# Patient Record
Sex: Female | Born: 1950 | Race: White | Hispanic: No | Smoking: Never smoker
Health system: Southern US, Community
[De-identification: ages and names within clinical notes are randomized; demographics above are authoritative.]

## PROBLEM LIST (undated history)

## (undated) DIAGNOSIS — R112 Nausea with vomiting, unspecified: Secondary | ICD-10-CM

## (undated) DIAGNOSIS — U099 Post covid-19 condition, unspecified: Secondary | ICD-10-CM

## (undated) DIAGNOSIS — R55 Syncope and collapse: Secondary | ICD-10-CM

## (undated) DIAGNOSIS — I10 Essential (primary) hypertension: Secondary | ICD-10-CM

## (undated) DIAGNOSIS — G4700 Insomnia, unspecified: Secondary | ICD-10-CM

## (undated) DIAGNOSIS — I480 Paroxysmal atrial fibrillation: Secondary | ICD-10-CM

## (undated) DIAGNOSIS — J849 Interstitial pulmonary disease, unspecified: Secondary | ICD-10-CM

## (undated) DIAGNOSIS — J302 Other seasonal allergic rhinitis: Secondary | ICD-10-CM

## (undated) DIAGNOSIS — K449 Diaphragmatic hernia without obstruction or gangrene: Secondary | ICD-10-CM

## (undated) DIAGNOSIS — D649 Anemia, unspecified: Secondary | ICD-10-CM

## (undated) DIAGNOSIS — I483 Typical atrial flutter: Secondary | ICD-10-CM

## (undated) DIAGNOSIS — Z9889 Other specified postprocedural states: Secondary | ICD-10-CM

## (undated) DIAGNOSIS — M542 Cervicalgia: Secondary | ICD-10-CM

## (undated) DIAGNOSIS — M130 Polyarthritis, unspecified: Secondary | ICD-10-CM

## (undated) HISTORY — DX: Diaphragmatic hernia without obstruction or gangrene: K44.9

## (undated) HISTORY — DX: Syncope and collapse: R55

## (undated) HISTORY — DX: Insomnia, unspecified: G47.00

## (undated) HISTORY — DX: Other seasonal allergic rhinitis: J30.2

## (undated) HISTORY — DX: Anemia, unspecified: D64.9

## (undated) HISTORY — DX: Paroxysmal atrial fibrillation: I48.0

## (undated) HISTORY — DX: Typical atrial flutter: I48.3

## (undated) HISTORY — DX: Essential (primary) hypertension: I10

## (undated) HISTORY — PX: TOE SURGERY: SHX1073

## (undated) HISTORY — DX: Post covid-19 condition, unspecified: U09.9

## (undated) HISTORY — DX: Interstitial pulmonary disease, unspecified: J84.9

## (undated) HISTORY — DX: Cervicalgia: M54.2

## (undated) HISTORY — PX: KNEE SURGERY: SHX244

## (undated) HISTORY — DX: Polyarthritis, unspecified: M13.0

---
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
## 1981-10-01 HISTORY — PX: TUBAL LIGATION: SHX77

## 1988-10-01 HISTORY — PX: TONSILLECTOMY: SUR1361

## 1998-06-02 ENCOUNTER — Ambulatory Visit: Admission: RE | Admit: 1998-06-02 | Discharge: 1998-06-02 | Payer: Self-pay | Admitting: Internal Medicine

## 2001-10-22 ENCOUNTER — Ambulatory Visit (HOSPITAL_COMMUNITY): Admission: RE | Admit: 2001-10-22 | Discharge: 2001-10-22 | Payer: Self-pay | Admitting: Neurology

## 2004-09-18 ENCOUNTER — Ambulatory Visit: Payer: Self-pay | Admitting: Family Medicine

## 2004-10-10 ENCOUNTER — Ambulatory Visit: Payer: Self-pay | Admitting: Internal Medicine

## 2005-01-22 ENCOUNTER — Ambulatory Visit: Payer: Self-pay | Admitting: Family Medicine

## 2005-03-21 ENCOUNTER — Ambulatory Visit: Payer: Self-pay | Admitting: Family Medicine

## 2005-08-09 ENCOUNTER — Ambulatory Visit: Payer: Self-pay | Admitting: Family Medicine

## 2005-10-30 ENCOUNTER — Ambulatory Visit: Payer: Self-pay | Admitting: Internal Medicine

## 2005-11-12 ENCOUNTER — Ambulatory Visit: Payer: Self-pay | Admitting: Family Medicine

## 2006-01-01 ENCOUNTER — Ambulatory Visit: Payer: Self-pay | Admitting: Family Medicine

## 2006-02-12 ENCOUNTER — Ambulatory Visit: Payer: Self-pay | Admitting: Family Medicine

## 2006-02-15 ENCOUNTER — Ambulatory Visit: Payer: Self-pay | Admitting: Family Medicine

## 2006-06-26 ENCOUNTER — Ambulatory Visit: Payer: Self-pay | Admitting: Family Medicine

## 2006-10-07 ENCOUNTER — Ambulatory Visit: Payer: Self-pay | Admitting: Internal Medicine

## 2006-10-22 ENCOUNTER — Ambulatory Visit: Payer: Self-pay

## 2006-12-19 ENCOUNTER — Ambulatory Visit: Payer: Self-pay | Admitting: Family Medicine

## 2007-10-02 HISTORY — PX: KNEE ARTHROSCOPY: SUR90

## 2007-10-09 ENCOUNTER — Ambulatory Visit: Payer: Self-pay | Admitting: Internal Medicine

## 2008-10-19 ENCOUNTER — Ambulatory Visit: Payer: Self-pay | Admitting: Internal Medicine

## 2009-01-21 ENCOUNTER — Encounter: Payer: Self-pay | Admitting: Internal Medicine

## 2009-10-19 DIAGNOSIS — I10 Essential (primary) hypertension: Secondary | ICD-10-CM | POA: Insufficient documentation

## 2009-10-19 DIAGNOSIS — I4891 Unspecified atrial fibrillation: Secondary | ICD-10-CM | POA: Insufficient documentation

## 2009-10-20 ENCOUNTER — Ambulatory Visit: Payer: Self-pay | Admitting: Internal Medicine

## 2010-10-31 ENCOUNTER — Encounter: Payer: Self-pay | Admitting: Internal Medicine

## 2010-11-02 NOTE — Letter (Signed)
Summary: Return To Work  [employer], Main Office  [address]   [place], [place] [address]   Phone: [phone]  Fax: [phone]    10/20/2009  TO: WHOM IT MAY CONCERN   RE: Heather Keith [address] [id number]   The above named individual is under my medical care and may work no more than 9hours/day and 45hours/ week until 11/01/09  If you have any further questions or need additional information, please call.     Sincerely,    Sharlot Gowda [facility]  Appended Document: Return To Work should be 2012

## 2010-11-02 NOTE — Assessment & Plan Note (Signed)
Summary: YEARLY/SL  Medications Added FLECAINIDE ACETATE 100 MG TABS (FLECAINIDE ACETATE) Take one tablet by mouth every 12 hours ZITHROMAX 1 GM PACK (AZITHROMYCIN) UAD TYLENOL 325 MG TABS (ACETAMINOPHEN) as needed * CINNAMON 1000MG  2 tabs daily when she could * MECLIZINE HCL as needed CYCLOBENZAPRINE HCL 10 MG TABS (CYCLOBENZAPRINE HCL) as needed ARTHROTEC 75 75-200 MG-MCG TABS (DICLOFENAC-MISOPROSTOL) two times a day as needed ZOLPIDEM TARTRATE 10 MG TABS (ZOLPIDEM TARTRATE) as needed      Allergies Added: ! PCN  Visit Type:  Follow-up Primary Provider:  Lysbeth Galas, MD  CC:  Numbness all over.  History of Present Illness: Heather Keith returns today for followup.  She is a very pleasant 60 year old woman with a history of paroxysmal atrial fibrillation and hypertension.  She has been controlled nicely now for over 10 years on flecainide and beta-blocker therapy.  She continues to do well.  In the last year, she has had only occasional episodes of atrial fibrillation typically lasting less than 30 seconds at a time.  She has had no syncope or near-syncope.   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   Current Medications (verified): 1)  Atenolol 25 Mg Tabs (Atenolol) .... Take One Tablet By Mouth Daily 2)  Flecainide Acetate 100 Mg Tabs (Flecainide Acetate) .... Take One Tablet By Mouth Every 12 Hours 3)  Aspirin 81 Mg Tbec (Aspirin) .... Take One Tablet By Mouth Daily 4)  Multivitamins   Tabs (Multiple Vitamin) .... Once Daily 5)  Simvastatin 20 Mg Tabs (Simvastatin) .... Take One Tablet By Mouth Daily At Bedtime 6)  Zithromax 1 Gm Pack (Azithromycin) .... Uad 7)  Tylenol 325 Mg Tabs (Acetaminophen) .... As Needed 8)  Cinnamon 1000mg  .... 2 Tabs Daily When She Could 9)  Meclizine Hcl .... As Needed 10)  Cyclobenzaprine Hcl 10 Mg Tabs (Cyclobenzaprine Hcl) .... As Needed 11)  Arthrotec 75 75-200 Mg-Mcg Tabs (Diclofenac-Misoprostol) .... Two Times A  Day As Needed 12)  Zolpidem Tartrate 10 Mg Tabs (Zolpidem Tartrate) .... As Needed  Allergies (verified): 1)  ! Pcn  Past History:  Past Medical History: Current Problems:  HYPERTENSION, UNSPECIFIED (ICD-401.9) ATRIAL FIBRILLATION (ICD-427.31)    Review of Systems  The patient denies chest pain, syncope, dyspnea on exertion, and peripheral edema.    Vital Signs:  Patient profile:   60 year old female Height:      70 inches Weight:      194 pounds BMI:     27.94 Pulse rate:   63 / minute BP sitting:   120 / 80  (left arm)  Vitals Entered By: Laurance Flatten CMA (October 20, 2009 4:01 PM)  Physical Exam  General:  Middle aged well developed, well nourished, in no acute distress.  HEENT: normal Neck: supple. No JVD. Carotids 2+ bilaterally no bruits Cor: RRR no rubs, gallops or murmur Lungs: CTA Ab: soft, nontender. nondistended. No HSM. Good bowel sounds Ext: warm. no cyanosis, clubbing or edema Neuro: alert and oriented. Grossly nonfocal. affect pleasant    EKG  Procedure date:  10/20/2009  Findings:      Normal sinus rhythm with rate of: 63. First degree AV-Block noted.    Impression & Recommendations:  Problem # 1:  ATRIAL FIBRILLATION (ICD-427.31) Her symptoms remain well controlled on her current meds.  Her ECG looks good with no significant QRS widening. Her updated medication list for this problem includes:    Atenolol 25 Mg Tabs (Atenolol) .Marland KitchenMarland KitchenMarland KitchenMarland Kitchen  Take one tablet by mouth daily    Flecainide Acetate 100 Mg Tabs (Flecainide acetate) .Marland Kitchen... Take one tablet by mouth every 12 hours    Aspirin 81 Mg Tbec (Aspirin) .Marland Kitchen... Take one tablet by mouth daily  Orders: EKG w/ Interpretation ([address])  Problem # 2:  HYPERTENSION, UNSPECIFIED (ICD-401.9) Her blood pressure remains well controlled. Continue current meds. A low sodium diet is recommended. Her updated medication list for this problem includes:    Atenolol 25 Mg Tabs (Atenolol) .Marland Kitchen... Take one tablet by mouth  daily    Aspirin 81 Mg Tbec (Aspirin) .Marland Kitchen... Take one tablet by mouth daily  Orders: EKG w/ Interpretation ([address])  Patient Instructions: 1)  Your physician recommends that you schedule a follow-up appointment in: 12 months with Dr Ladona Ridgel Prescriptions: FLECAINIDE ACETATE 100 MG TABS (FLECAINIDE ACETATE) Take one tablet by mouth every 12 hours  #60 x 11   Entered by:   Dennis Bast, RN, BSN   Authorized by:   Laren Boom, MD, [facility]   Signed by:   Dennis Bast, RN, BSN on 10/20/2009   Method used:   Electronically to        [facility]  [address] [id number] (retail)       [address]       [place], [place]  [address]       Ph: [phone] or [phone]       Fax: [phone]   RxID:   [id number] ATENOLOL 25 MG TABS (ATENOLOL) Take one tablet by mouth daily  #34 x 11   Entered by:   Dennis Bast, RN, BSN   Authorized by:   Laren Boom, MD, [facility]   Signed by:   Dennis Bast, RN, BSN on 10/20/2009   Method used:   Electronically to        [facility]  [address] [id number] (retail)       [address]       [place], [place]  [address]       Ph: [phone] or [phone]       Fax: [phone]   RxID:   [id number]

## 2010-11-08 ENCOUNTER — Ambulatory Visit: Payer: Self-pay | Admitting: Internal Medicine

## 2010-11-13 ENCOUNTER — Encounter: Payer: Self-pay | Admitting: Internal Medicine

## 2010-11-13 ENCOUNTER — Encounter (INDEPENDENT_AMBULATORY_CARE_PROVIDER_SITE_OTHER): Payer: Federal, State, Local not specified - PPO | Admitting: Internal Medicine

## 2010-11-13 DIAGNOSIS — I10 Essential (primary) hypertension: Secondary | ICD-10-CM

## 2010-11-13 DIAGNOSIS — I4891 Unspecified atrial fibrillation: Secondary | ICD-10-CM

## 2010-11-22 NOTE — Assessment & Plan Note (Signed)
Summary: Heather Keith  Medications Added LIPITOR 10 MG TABS (ATORVASTATIN CALCIUM) Take one tablet by mouth daily.      Allergies Added:   Visit Type:  Follow-up Primary Provider:  Lysbeth Galas, MD  CC:  dizziness with in the last couple of weeks.  History of Present Illness: Heather Keith returns today for followup.  She is a very pleasant 60 year old woman with a history of paroxysmal atrial fibrillation and hypertension.  She has been controlled nicely now for over 10 years on flecainide and beta-blocker therapy.  She continues to do well.  In the last year, she has had only occasional episodes of atrial fibrillation typically lasting less than 30 seconds at a time.  She has had no syncope or near-syncope.   She has had problems with her back and is out on sick leave. She is scheduled to retire.  Current Medications (verified): 1)  Atenolol 25 Mg Tabs (Atenolol) .... Take One Tablet By Mouth Daily 2)  Flecainide Acetate 100 Mg Tabs (Flecainide Acetate) .... Take One Tablet By Mouth Every 12 Hours 3)  Aspirin 81 Mg Tbec (Aspirin) .... Take One Tablet By Mouth Daily 4)  Multivitamins   Tabs (Multiple Vitamin) .... Once Daily 5)  Tylenol 325 Mg Tabs (Acetaminophen) .... As Needed 6)  Cinnamon 1000mg  .... 2 Tabs Daily When She Could 7)  Meclizine Hcl .... As Needed 8)  Arthrotec 75 75-200 Mg-Mcg Tabs (Diclofenac-Misoprostol) .... Two Times A Day As Needed 9)  Zolpidem Tartrate 10 Mg Tabs (Zolpidem Tartrate) .... As Needed 10)  Lipitor 10 Mg Tabs (Atorvastatin Calcium) .... Take One Tablet By Mouth Daily.  Allergies (verified): 1)  ! Pcn  Past History:  Past Medical History: Last updated: 10/20/2009 Current Problems:  HYPERTENSION, UNSPECIFIED (ICD-401.9) ATRIAL FIBRILLATION (ICD-427.31)    Past Surgical History: Last updated: 10/19/2009 Knee surgery  Review of Systems  The patient denies chest pain, syncope, dyspnea on exertion, and peripheral edema.    Vital  Signs:  Patient profile:   60 year old female Height:      70 inches Weight:      185 pounds BMI:     26.64 Pulse rate:   64 / minute BP sitting:   106 / 68  (left arm) Cuff size:   regular  Vitals Entered By: Heather Keith CMA (November 13, 2010 12:07 PM)  Physical Exam  General:  Middle aged well developed, well nourished, in no acute distress.  HEENT: normal Neck: supple. No JVD. Carotids 2+ bilaterally no bruits Cor: RRR no rubs, gallops or murmur Lungs: CTA Ab: soft, nontender. nondistended. No HSM. Good bowel sounds Ext: warm. no cyanosis, clubbing or edema Neuro: alert and oriented. Grossly nonfocal. affect pleasant    EKG  Procedure date:  11/13/2010  Findings:      Sinus bradycardia with rate of:  54.  Impression & Recommendations:  Problem # 1:  ATRIAL FIBRILLATION (ICD-427.31) Her symptoms remain well controlled. she will continue her current meds. Her updated medication list for this problem includes:    Atenolol 25 Mg Tabs (Atenolol) .Marland Kitchen... Take one tablet by mouth daily    Flecainide Acetate 100 Mg Tabs (Flecainide acetate) .Marland Kitchen... Take one tablet by mouth every 12 hours    Aspirin 81 Mg Tbec (Aspirin) .Marland Kitchen... Take one tablet by mouth daily  Problem # 2:  HYPERTENSION, UNSPECIFIED (ICD-401.9) Her blood pressure has been well controlled. Continue meds as  below and maintain a low sodium diet. Her updated medication list for  this problem includes:    Atenolol 25 Mg Tabs (Atenolol) .Marland Kitchen... Take one tablet by mouth daily    Aspirin 81 Mg Tbec (Aspirin) .Marland Kitchen... Take one tablet by mouth daily  Patient Instructions: 1)  Your physician wants you to follow-up in:12 months with Dr Court Joy will receive a reminder letter in the mail two months in advance. If you don't receive a letter, please call our office to schedule the follow-up appointment. 2)  Your physician recommends that you continue on your current medications as directed. Please refer to the Current Medication  list given to you today.

## 2011-02-13 NOTE — Assessment & Plan Note (Signed)
[facility]                         ELECTROPHYSIOLOGY OFFICE NOTE   NAME:WILLIAMSTayllor, Breitenstein                    MRN:          [id number]  DATE:10/09/2007                            DOB:          [birth year]    Ms. Watling returns today for follow-up.  She is a very pleasant middle-  aged woman with paroxysmal atrial fibrillation and hypertension, who  returns today for follow-up.  She had no specific complaints today.  She  has rare palpitations.   MEDICATIONS:  1. Flecainide 100 mg twice daily.  2. Atenolol 25 mg daily.  3. Aspirin 81 daily.  4. Potassium.   PHYSICAL EXAM:  She is a pleasant, well-appearing middle-aged woman in  no distress.  Blood pressure was 110/80, the pulse 56 and regular, respirations were  16.  The weight was 211 pounds.  NECK:  No jugular distention.  LUNGS:  Clear bilaterally to auscultation.  No wheezes, rales or rhonchi  were present.  CARDIOVASCULAR:  A regular rate and rhythm with normal S1 and S2.  ABDOMEN:  Soft, nontender.  EXTREMITIES:  No cyanosis, clubbing or edema.  The pulses were 2+ and  symmetric.   The EKG demonstrates sinus rhythm with sinus bradycardia.   IMPRESSION:  1. Paroxysmal atrial fibrillation.  2. Hypertension.  3. Chronic flecainide therapy.   DISCUSSION:  Overall, Ms. Fagerstrom is stable.  Her atrial fibrillation  has very nicely been controlled with flecainide.  I will plan to see her  back in the office in 1 year.     Doylene Canning. Ladona Ridgel, MD  Electronically Signed    GWT/MedQ  DD: 10/09/2007  DT: 10/09/2007  Job #: [id number]   cc:   Delaney Meigs, M.D.

## 2011-02-13 NOTE — Assessment & Plan Note (Signed)
[facility]                         ELECTROPHYSIOLOGY OFFICE NOTE   NAME:Heather Keith, Heather Keith                    MRN:          [id number]  DATE:10/19/2008                            DOB:          [birth year]    Heather Keith returns today for followup.  She is a very pleasant 60-year-  old woman with a history of paroxysmal atrial fibrillation and  hypertension.  She has been controlled nicely now for over 10 years on  flecainide and beta-blocker therapy.  She continues to do well.  In the  last year, she has had only occasional episodes of atrial fibrillation  typically lasting less than 30 seconds at a time.  She has had no  syncope or near-syncope with anything.  She has in the interim however  undergone knee surgery secondary to torn meniscus for which she is  presently stable.  She returns today for followup.  She denies chest  pain.  She denies shortness of breath.   CURRENT MEDICATIONS:  1. Flecainide 100 twice a day.  2. Atenolol 25 a day.  3. Aspirin 81 a day.  4. Multivitamin.  5. Zocor 20 a day.  6. Arthrotec p.r.n.   PHYSICAL EXAMINATION:  GENERAL:  She is a pleasant well-appearing middle-  aged woman in no distress.  VITAL SIGNS:  Blood pressure today was 150/75, the pulse was 60 and  regular, respirations were 18, weight was 211 pounds.  NECK:  Revealed no jugular venous distention.  LUNGS:  Clear bilaterally to auscultation.  No wheezes, rales, or  rhonchi are present.  There is no increased work of breathing.  CARDIOVASCULAR:  Regular rate and rhythm.  Normal S1 and S2.  There are  no murmurs, rubs, or gallops present.  ABDOMEN:  Soft, nontender.  There is no organomegaly.  EXTREMITIES:  Demonstrated no edema.   EKG demonstrates sinus rhythm with a rightward axis.   IMPRESSION:  1. Paroxysmal atrial fibrillation, now well-controlled on flecainide      and beta-blockers.  2. Hypertension, also well controlled.  3. Recent knee  surgery, presently stable.   DISCUSSION:  Heather Keith is doing quite well.  We will continue her  current medical therapy with flecainide and beta-blockers.  Her EKG  today is normal otherwise and we will continue to see her back in the  office in 1 year, sooner should she have recurrent symptomatic atrial  fib.     Doylene Canning. Ladona Ridgel, MD  Electronically Signed    GWT/MedQ  DD: 10/19/2008  DT: 10/20/2008  Job #: [id number]   cc:   Delaney Meigs, M.D.

## 2011-02-16 NOTE — Assessment & Plan Note (Signed)
[facility]                         ELECTROPHYSIOLOGY OFFICE NOTE   NAME:WILLIAMSJaisha, [name]                    MRN:          [id number]  DATE:10/07/2006                            DOB:          16-Oct-1950    Heather Keith returns today for followup.  She is a very pleasant middle-  aged woman with a history of paroxysmal atrial fibrillation who has done  quite nicely on a combination of flecainide and low-dose beta blockers  for the last several years.  She does have palpitations intermittently,  and in the last year she has noted increasing dyspnea with exertion and  some chest heaviness.  She denies neck or jaw pain, but does note some  chest tightness with her shortness of breath.  These are fairly  infrequent episodes, but they have occurred lately more often, typically  when she is walking fast up a steep hill.  She denies peripheral edema.   PHYSICAL EXAMINATION:  GENERAL:  She is a pleasant, middle-aged woman in  no distress.  VITAL SIGNS:  Blood pressure was 121/76, pulse 67 and regular,  respirations 18.  The weight was 216 pounds.  NECK:  No jugular venous distention.  LUNGS:  Clear bilaterally to auscultation.  CARDIOVASCULAR:  Regular rate and rhythm with normal S1 and S2.  EXTREMITIES:  No edema.   Her EKG demonstrates sinus rhythm with normal axis and intervals.   IMPRESSION:  1. Symptomatic paroxysmal atrial fibrillation.  2. Worsening chest pressure and shortness of breath with exertion.  3. Arthritis.   DISCUSSION:  Ms. Silberstein' chest pain and shortness of breath are  somewhat concerning, although they do not appear to be in an unstable  fashion.  I have recommended that she undergo a chest x-ray and exercise  Myoview study for additional evaluation, the last of both being more  than 5 years ago.     Doylene Canning. Ladona Ridgel, MD  Electronically Signed    GWT/MedQ  DD: 10/07/2006  DT: 10/08/2006  Job #: [id number]   cc:   Delaney Meigs, M.D.

## 2011-05-29 ENCOUNTER — Other Ambulatory Visit: Payer: Self-pay | Admitting: Internal Medicine

## 2011-05-30 ENCOUNTER — Other Ambulatory Visit: Payer: Self-pay | Admitting: *Deleted

## 2011-05-30 MED ORDER — ATENOLOL 25 MG PO TABS: 25.0000 mg | ORAL_TABLET | Freq: Every day | ORAL | Status: DC

## 2011-07-17 ENCOUNTER — Other Ambulatory Visit: Payer: Self-pay | Admitting: Rheumatology

## 2011-07-17 ENCOUNTER — Telehealth: Payer: Self-pay | Admitting: Internal Medicine

## 2011-07-17 ENCOUNTER — Ambulatory Visit
Admission: RE | Admit: 2011-07-17 | Discharge: 2011-07-17 | Disposition: A | Discharge status: Home / Self Care | Payer: Federal, State, Local not specified - PPO | Source: Ambulatory Visit | Attending: Rheumatology | Admitting: Rheumatology

## 2011-07-17 DIAGNOSIS — M542 Cervicalgia: Secondary | ICD-10-CM

## 2011-07-17 NOTE — Telephone Encounter (Signed)
Or [phone], was given prednisone, wants to make sure ok due to her a-fib

## 2011-07-17 NOTE — Telephone Encounter (Signed)
Went to Rheumatologist today Dr Kellie Simmering prescribed Prednisone dose pack for her for neck and shoulder pain  She has not had an episode of afib 2000 She will wait and let me talk with Dr Ladona Ridgel and I will call her tomorrow regarding her recomendations

## 2011-07-25 NOTE — Telephone Encounter (Signed)
Discussed with Dr Ladona Ridgel  He says may or may not cause her to have afib Patient did not pick up medicine She did not want to risk it  She says her shoulder is better  She has been going to yoga and it is feeling better

## 2011-11-13 ENCOUNTER — Ambulatory Visit (INDEPENDENT_AMBULATORY_CARE_PROVIDER_SITE_OTHER): Payer: Federal, State, Local not specified - PPO | Admitting: Internal Medicine

## 2011-11-13 DIAGNOSIS — I4891 Unspecified atrial fibrillation: Secondary | ICD-10-CM

## 2011-11-13 DIAGNOSIS — I10 Essential (primary) hypertension: Secondary | ICD-10-CM

## 2011-11-13 NOTE — Patient Instructions (Signed)
Your physician wants you to follow-up in: 12 months with Dr. Taylor. You will receive a reminder letter in the mail two months in advance. If you don't receive a letter, please call our office to schedule the follow-up appointment.    

## 2011-11-13 NOTE — Assessment & Plan Note (Signed)
Her symptoms are well controlled. She will continue her current medical therapy. 

## 2011-11-13 NOTE — Assessment & Plan Note (Signed)
Her blood pressure is well controlled. She will continue her current medications and maintain a low-sodium diet. 

## 2011-11-13 NOTE — Progress Notes (Signed)
HPI Heather Keith returns today for followup. She is a 61 year old woman with paroxysmal atrial fibrillation, hypertension, and dyslipidemia. Over the last year she has done well. She denies syncope. She has rare palpitations which are typically nonsustained lasting up to a minute at a time. No chest pain, and no shortness of breath or peripheral edema. Allergies  Allergen Reactions  . Penicillins      Current Outpatient Prescriptions  Medication Sig Dispense Refill  . acetaminophen (TYLENOL) 650 MG CR tablet Take 650 mg by mouth every 8 (eight) hours as needed.      Marland Kitchen atenolol (TENORMIN) 25 MG tablet Take 1 tablet (25 mg total) by mouth daily.  30 tablet  6  . atorvastatin (LIPITOR) 10 MG tablet Take 10 mg by mouth daily.      . diclofenac-misoprostol (ARTHROTEC 75) 75-200 MG-MCG per tablet Take 1 tablet by mouth as needed.      . flecainide (TAMBOCOR) 100 MG tablet TAKE 1 TABLET EVERY 12 HOURS  68 tablet  7  . Multiple Vitamins-Minerals (MULTIVITAMIN WITH MINERALS) tablet Take 1 tablet by mouth daily.      Marland Kitchen zolpidem (AMBIEN) 5 MG tablet Take 5 mg by mouth at bedtime as needed.         Past Medical History  Diagnosis Date  . HTN (hypertension)   . AF (atrial fibrillation)     ROS:   All systems reviewed and negative except as noted in the HPI.   Past Surgical History  Procedure Date  . Knee surgery      No family history on file.   History   Social History  . Marital Status: Widowed    Spouse Name: N/A    Number of Children: N/A  . Years of Education: N/A   Occupational History  . Not on file.   Social History Main Topics  . Smoking status: Never Smoker   . Smokeless tobacco: Not on file  . Alcohol Use: Not on file  . Drug Use: Not on file  . Sexually Active: Not on file   Other Topics Concern  . Not on file   Social History Narrative  . No narrative on file     BP 110/72  Pulse 51  Ht 5\' 10"  (1.778 m)  Wt 89.812 kg (198 lb)  BMI 28.41  kg/m2  Physical Exam:  Well appearing middle-aged woman, NAD HEENT: Unremarkable Neck:  No JVD, no thyromegally Lungs:  Clear with no wheezes, rales, or rhonchi. HEART:  Regular bradycardia, no murmurs, no rubs, no clicks Abd:  soft, positive bowel sounds, no organomegally, no rebound, no guarding Ext:  2 plus pulses, no edema, no cyanosis, no clubbing Skin:  No rashes no nodules Neuro:  CN II through XII intact, motor grossly intact  EKG Sinus bradycardia with first degree AV block  Assess/Plan:

## 2012-01-11 ENCOUNTER — Other Ambulatory Visit: Payer: Self-pay | Admitting: Internal Medicine

## 2012-01-11 ENCOUNTER — Other Ambulatory Visit: Payer: Self-pay

## 2012-01-11 MED ORDER — ATENOLOL 25 MG PO TABS: 25.0000 mg | ORAL_TABLET | Freq: Every day | ORAL | Status: DC

## 2012-02-09 ENCOUNTER — Other Ambulatory Visit: Payer: Self-pay | Admitting: Internal Medicine

## 2012-11-10 ENCOUNTER — Ambulatory Visit (INDEPENDENT_AMBULATORY_CARE_PROVIDER_SITE_OTHER): Payer: Federal, State, Local not specified - PPO | Admitting: Internal Medicine

## 2012-11-10 ENCOUNTER — Encounter: Payer: Self-pay | Admitting: Internal Medicine

## 2012-11-10 VITALS — BP 133/80 | HR 64 | Ht 70.0 in | Wt 201.6 lb

## 2012-11-10 DIAGNOSIS — I10 Essential (primary) hypertension: Secondary | ICD-10-CM

## 2012-11-10 DIAGNOSIS — I4891 Unspecified atrial fibrillation: Secondary | ICD-10-CM

## 2012-11-10 NOTE — Assessment & Plan Note (Signed)
She is maintaining sinus rhythm very nicely. She will continue her current medical therapy. 

## 2012-11-10 NOTE — Progress Notes (Signed)
HPI Mrs. Thackston returns today for followup. She is a very pleasant 62 year old woman with a history of paroxysmal atrial fibrillation and hypertension. Her palpitations have been well-controlled in the past year. She has recently retired. She is still walking up to 2 miles a day. She has gained some weight however in retirement. She denies chest pain or shortness of breath. No peripheral edema or syncope. She has rare palpitations. Allergies  Allergen Reactions  . Penicillins      Current Outpatient Prescriptions  Medication Sig Dispense Refill  . acetaminophen (TYLENOL) 650 MG CR tablet Take 650 mg by mouth every 8 (eight) hours as needed.      Marland Kitchen atenolol (TENORMIN) 25 MG tablet Take 1 tablet (25 mg total) by mouth daily.  30 tablet  10  . atorvastatin (LIPITOR) 10 MG tablet Take 10 mg by mouth daily.      . diclofenac-misoprostol (ARTHROTEC 75) 75-200 MG-MCG per tablet Take 1 tablet by mouth as needed.      . flecainide (TAMBOCOR) 100 MG tablet TAKE 1 TABLET EVERY 12 HOURS  68 tablet  7  . Multiple Vitamins-Minerals (MULTIVITAMIN WITH MINERALS) tablet Take 1 tablet by mouth daily.      . simvastatin (ZOCOR) 20 MG tablet       . zolpidem (AMBIEN) 5 MG tablet Take 5 mg by mouth at bedtime as needed.       No current facility-administered medications for this visit.     Past Medical History  Diagnosis Date  . HTN (hypertension)   . AF (atrial fibrillation)     ROS:   All systems reviewed and negative except as noted in the HPI.   Past Surgical History  Procedure Laterality Date  . Knee surgery       No family history on file.   History   Social History  . Marital Status: Widowed    Spouse Name: N/A    Number of Children: N/A  . Years of Education: N/A   Occupational History  . Not on file.   Social History Main Topics  . Smoking status: Never Smoker   . Smokeless tobacco: Not on file  . Alcohol Use: Not on file  . Drug Use: Not on file  . Sexually Active:  Not on file   Other Topics Concern  . Not on file   Social History Narrative  . No narrative on file     BP 133/80  Pulse 64  Ht 5\' 10"  (1.778 m)  Wt 201 lb 9.6 oz (91.445 kg)  BMI 28.93 kg/m2  Physical Exam:  Well appearing middle-age woman,NAD HEENT: Unremarkable Neck:  No JVD, no thyromegally Lungs:  Clear with no wheezes, rales, or rhonchi. HEART:  Regular rate rhythm, no murmurs, no rubs, no clicks Abd:  soft, positive bowel sounds, no organomegally, no rebound, no guarding Ext:  2 plus pulses, no edema, no cyanosis, no clubbing Skin:  No rashes no nodules Neuro:  CN II through XII intact, motor grossly intact  EKG Normal sinus rhythm with nonspecific T wave abnormality.  Assess/Plan:

## 2012-11-10 NOTE — Assessment & Plan Note (Signed)
Her blood pressure is minimally elevated. She is instructed to maintain a low-sodium diet, lose weight, and continue her current medical therapy.

## 2012-11-10 NOTE — Patient Instructions (Signed)
Your physician wants you to follow-up in: 12 months with Dr. Taylor. You will receive a reminder letter in the mail two months in advance. If you don't receive a letter, please call our office to schedule the follow-up appointment.    

## 2012-11-11 ENCOUNTER — Other Ambulatory Visit: Payer: Self-pay | Admitting: Internal Medicine

## 2012-12-13 ENCOUNTER — Other Ambulatory Visit: Payer: Self-pay | Admitting: Internal Medicine

## 2013-03-12 ENCOUNTER — Other Ambulatory Visit: Payer: Self-pay

## 2013-03-12 MED ORDER — ATENOLOL 25 MG PO TABS: ORAL_TABLET | ORAL | Status: DC

## 2013-04-28 ENCOUNTER — Emergency Department (HOSPITAL_COMMUNITY)
Admission: EM | Admit: 2013-04-28 | Discharge: 2013-04-28 | Disposition: A | Discharge status: Home / Self Care | Payer: Federal, State, Local not specified - PPO | Attending: Emergency Medicine | Admitting: Emergency Medicine

## 2013-04-28 ENCOUNTER — Encounter (HOSPITAL_COMMUNITY): Payer: Self-pay

## 2013-04-28 DIAGNOSIS — Z880 Allergy status to penicillin: Secondary | ICD-10-CM | POA: Insufficient documentation

## 2013-04-28 DIAGNOSIS — R55 Syncope and collapse: Secondary | ICD-10-CM

## 2013-04-28 DIAGNOSIS — Z79899 Other long term (current) drug therapy: Secondary | ICD-10-CM | POA: Insufficient documentation

## 2013-04-28 DIAGNOSIS — Z8679 Personal history of other diseases of the circulatory system: Secondary | ICD-10-CM | POA: Insufficient documentation

## 2013-04-28 DIAGNOSIS — I10 Essential (primary) hypertension: Secondary | ICD-10-CM | POA: Insufficient documentation

## 2013-04-28 DIAGNOSIS — Z7982 Long term (current) use of aspirin: Secondary | ICD-10-CM | POA: Insufficient documentation

## 2013-04-28 HISTORY — DX: Syncope and collapse: R55

## 2013-04-28 LAB — URINALYSIS, ROUTINE W REFLEX MICROSCOPIC
Bilirubin Urine: NEGATIVE
Glucose, UA: NEGATIVE mg/dL
Hgb urine dipstick: NEGATIVE
Ketones, ur: NEGATIVE mg/dL
pH: 8 (ref 5.0–8.0)

## 2013-04-28 LAB — POCT I-STAT, CHEM 8
BUN: 18 mg/dL (ref 6–23)
Calcium, Ion: 1.25 mmol/L (ref 1.13–1.30)
Chloride: 103 mEq/L (ref 96–112)
Creatinine, Ser: 1 mg/dL (ref 0.50–1.10)
Glucose, Bld: 131 mg/dL — ABNORMAL HIGH (ref 70–99)

## 2013-04-28 NOTE — ED Notes (Signed)
Pt discharged.Vital signs stable and GCS 15 

## 2013-04-28 NOTE — ED Notes (Signed)
Pt at dentist having a tooth extracted. Syncopal episode while lying in dental chair. Pt did not hit her head.

## 2013-04-28 NOTE — ED Provider Notes (Signed)
CSN: [id number]     Arrival date & time 04/28/13  1627 History     First MD Initiated Contact with Patient 04/28/13 1643     Chief Complaint  Patient presents with  . Loss of Consciousness   (Consider location/radiation/quality/duration/timing/severity/associated sxs/prior Treatment) HPI Comments: Patient with history of atrial fibrillation, well-controlled on flecainide -- presents after having 2 syncopal episodes while at the dentist today. Patient states that she was having a tooth extracted and when the dentist was pulling on her tooth she began to feel lightheaded. She woke up with dentist calling her name. Patient was then sipping on Coke 5 minutes later when she began feeling lightheaded again and passed out. EMS was called. Patient now feels well. Patient states that she has passed out in the past after having IV started on her. She denies any chest pain or shortness of breath. She denies any bleeding or blood in her stool. Onset of symptoms acute. Course is resolved. Nothing makes symptoms better.  Patient is a 62 y.o. female presenting with syncope. The history is provided by the patient.  Loss of Consciousness Associated symptoms: no chest pain, no fever, no headaches, no nausea, no palpitations and no vomiting     Past Medical History  Diagnosis Date  . HTN (hypertension)   . AF (atrial fibrillation)    Past Surgical History  Procedure Laterality Date  . Knee surgery     No family history on file. History  Substance Use Topics  . Smoking status: Never Smoker   . Smokeless tobacco: Not on file  . Alcohol Use: Not on file   OB History   Grav Para Term Preterm Abortions TAB SAB Ect Mult Living                 Review of Systems  Constitutional: Negative for fever.  HENT: Negative for sore throat and rhinorrhea.   Eyes: Negative for redness.  Respiratory: Negative for cough.   Cardiovascular: Positive for syncope. Negative for chest pain, palpitations and leg  swelling.  Gastrointestinal: Negative for nausea, vomiting, abdominal pain, diarrhea and blood in stool.  Genitourinary: Negative for dysuria, hematuria and vaginal bleeding.  Musculoskeletal: Negative for myalgias.  Skin: Negative for rash.  Neurological: Positive for syncope and light-headedness. Negative for headaches.    Allergies  Penicillins  Home Medications   Current Outpatient Rx  Name  Route  Sig  Dispense  Refill  . acetaminophen (TYLENOL) 650 MG CR tablet   Oral   Take 650 mg by mouth every 8 (eight) hours as needed.         Marland Kitchen aspirin 81 MG tablet   Oral   Take 81 mg by mouth daily.         Marland Kitchen atenolol (TENORMIN) 25 MG tablet   Oral   Take 25 mg by mouth daily.         . diclofenac-misoprostol (ARTHROTEC 75) 75-200 MG-MCG per tablet   Oral   Take 1 tablet by mouth as needed.         . flecainide (TAMBOCOR) 100 MG tablet   Oral   Take 100 mg by mouth 2 (two) times daily.         Marland Kitchen ibuprofen (ADVIL,MOTRIN) 200 MG tablet   Oral   Take 600 mg by mouth every 6 (six) hours as needed for pain.         . Multiple Vitamins-Minerals (MULTIVITAMIN WITH MINERALS) tablet   Oral   Take 1  tablet by mouth daily.         . simvastatin (ZOCOR) 20 MG tablet               . zolpidem (AMBIEN) 5 MG tablet   Oral   Take 5 mg by mouth at bedtime as needed.          BP 117/68  Pulse 64  Temp(Src) 96.9 F (36.1 C) (Oral)  Resp 20  SpO2 99%  Physical Exam  Nursing note and vitals reviewed. Constitutional: She appears well-developed and well-nourished.  HENT:  Head: Normocephalic and atraumatic.  Mouth/Throat: Mucous membranes are normal. Mucous membranes are not dry.  Eyes: Conjunctivae are normal.  Neck: Trachea normal and normal range of motion. Neck supple. Normal carotid pulses and no JVD present. No muscular tenderness present. Carotid bruit is not present. No tracheal deviation present.  Cardiovascular: Normal rate, regular rhythm, S1 normal,  S2 normal, normal heart sounds and intact distal pulses.  Exam reveals no decreased pulses.   No murmur heard. Pulmonary/Chest: Effort normal. No respiratory distress. She has no wheezes. She exhibits no tenderness.  Abdominal: Soft. Normal aorta and bowel sounds are normal. There is no tenderness. There is no rebound and no guarding.  Musculoskeletal: Normal range of motion.  Neurological: She is alert.  Skin: Skin is warm and dry. She is not diaphoretic. No cyanosis. No pallor.  Psychiatric: She has a normal mood and affect.    ED Course   Procedures (including critical care time)  Labs Reviewed  POCT I-STAT, CHEM 8 - Abnormal; Notable for the following:    Glucose, Bld 131 (*)    All other components within normal limits  URINALYSIS, ROUTINE W REFLEX MICROSCOPIC   No results found. 1. Vasovagal syncope     5:09 PM Patient seen and examined. Work-up initiated. Medications ordered.   Vital signs reviewed and are as follows: Filed Vitals:   04/28/13 1649  BP: 117/68  Pulse: 64  Temp: 96.9 F (36.1 C)  Resp: 20    Date: 04/28/2013  Rate: 69  Rhythm: normal sinus rhythm  QRS Axis: normal  Intervals: PR prolonged  ST/T Wave abnormalities: normal  Conduction Disutrbances:first-degree A-V block   Narrative Interpretation:   Old EKG Reviewed: unchanged from 11/10/2012  6:26 PM Pt d/w Dr. Anitra Lauth. Patient continues to do well. Patient was informed of all results.  Urged to return to the emergency department with additional episodes of syncope and to followup with primary care physician. Patient verbalizes understanding and agrees with the plan.   MDM  Patient with syncope x2 during dental procedure. Strongly suspect vasovagal due to event. No CP or palpitations. She has had similar events in past (ex during IV starts). Suspect this is same phenomenon. EKG unchanged. No orthostasis. Hgb 14.6. She appears well. Feel pt can be safely discharged.     Renne Crigler,  PA-C 04/28/13 1831

## 2013-04-28 NOTE — ED Provider Notes (Signed)
Medical screening examination/treatment/procedure(s) were performed by non-physician practitioner and as supervising physician I was immediately available for consultation/collaboration.   Gwyneth Sprout, MD 04/28/13 [phone]

## 2013-08-24 ENCOUNTER — Other Ambulatory Visit: Payer: Self-pay | Admitting: Internal Medicine

## 2013-08-26 ENCOUNTER — Other Ambulatory Visit: Payer: Self-pay | Admitting: Internal Medicine

## 2013-09-03 ENCOUNTER — Other Ambulatory Visit: Payer: Self-pay | Admitting: Internal Medicine

## 2013-10-22 ENCOUNTER — Other Ambulatory Visit: Payer: Self-pay | Admitting: Internal Medicine

## 2013-11-10 ENCOUNTER — Encounter: Payer: Self-pay | Admitting: Internal Medicine

## 2013-11-10 ENCOUNTER — Ambulatory Visit (INDEPENDENT_AMBULATORY_CARE_PROVIDER_SITE_OTHER): Payer: Federal, State, Local not specified - PPO | Admitting: Internal Medicine

## 2013-11-10 VITALS — BP 120/78 | HR 58 | Ht 70.0 in | Wt 199.0 lb

## 2013-11-10 DIAGNOSIS — I4891 Unspecified atrial fibrillation: Secondary | ICD-10-CM

## 2013-11-10 MED ORDER — ATENOLOL 25 MG PO TABS: ORAL_TABLET | ORAL | Status: DC

## 2013-11-10 MED ORDER — FLECAINIDE ACETATE 100 MG PO TABS: 100.0000 mg | ORAL_TABLET | Freq: Two times a day (BID) | ORAL | Status: DC

## 2013-11-10 NOTE — Progress Notes (Signed)
HPI [name] returns today for followup. She is a very pleasant 63 year old woman with a history of paroxysmal atrial fibrillation and hypertension. Her palpitations have been well-controlled in the past year. She has been retired for over a year. She is still walking up to 2 miles a day. . She denies chest pain or shortness of breath. No peripheral edema or syncope. She has rare palpitations. She had a syncopal episode during a tooth extraction. She notes that she has had syncope in the past., associated with seizure like activity, and negative neuro work up. Allergies  Allergen Reactions  . Penicillins Rash     Current Outpatient Prescriptions  Medication Sig Dispense Refill  . acetaminophen (TYLENOL) 650 MG CR tablet Take 650 mg by mouth every 8 (eight) hours as needed.      Marland Kitchen. aspirin 81 MG tablet Take 81 mg by mouth daily.      Marland Kitchen. atenolol (TENORMIN) 25 MG tablet TAKE 1 TABLET BY MOUTH ONCE DAILY  90 tablet  0  . diclofenac-misoprostol (ARTHROTEC 75) 75-200 MG-MCG per tablet Take 1 tablet by mouth as needed.      . flecainide (TAMBOCOR) 100 MG tablet Take 100 mg by mouth 2 (two) times daily.      . Multiple Vitamins-Minerals (MULTIVITAMIN WITH MINERALS) tablet Take 1 tablet by mouth daily.      . simvastatin (ZOCOR) 20 MG tablet Take 20 mg by mouth daily.       Marland Kitchen. zolpidem (AMBIEN) 5 MG tablet Take 5 mg by mouth at bedtime as needed.       No current facility-administered medications for this visit.     Past Medical History  Diagnosis Date  . HTN (hypertension)   . AF (atrial fibrillation)   . Palpitations     Rare  . Vasovagal syncope 04/28/13  . Neck pain     ROS:   All systems reviewed and negative except as noted in the HPI.   Past Surgical History  Procedure Laterality Date  . Knee surgery       No family history on file.   History   Social History  . Marital Status: Widowed    Spouse Name: N/A    Number of Children: N/A  . Years of Education: N/A    Occupational History  . Not on file.   Social History Main Topics  . Smoking status: Never Smoker   . Smokeless tobacco: Not on file  . Alcohol Use: Not on file  . Drug Use: Not on file  . Sexual Activity: Not on file   Other Topics Concern  . Not on file   Social History Narrative  . No narrative on file     BP 120/78  Pulse 58  Ht 5\' 10"  (1.778 m)  Wt 199 lb (90.266 kg)  BMI 28.55 kg/m2  Physical Exam:  Well appearing middle-age woman,NAD HEENT: Unremarkable Neck:  No JVD, no thyromegally Lungs:  Clear with no wheezes, rales, or rhonchi. HEART:  Regular rate rhythm, no murmurs, no rubs, no clicks Abd:  soft, positive bowel sounds, no organomegally, no rebound, no guarding Ext:  2 plus pulses, no edema, no cyanosis, no clubbing Skin:  No rashes no nodules Neuro:  CN II through XII intact, motor grossly intact  EKG Normal sinus rhythm with nonspecific T wave abnormality and first degree AV block.  Assess/Plan:

## 2013-11-10 NOTE — Patient Instructions (Signed)
Your physician wants you to follow-up in: 12 months with Dr. Taylor. You will receive a reminder letter in the mail two months in advance. If you don't receive a letter, please call our office to schedule the follow-up appointment.    

## 2013-12-21 ENCOUNTER — Other Ambulatory Visit: Payer: Self-pay | Admitting: *Deleted

## 2013-12-21 DIAGNOSIS — I4891 Unspecified atrial fibrillation: Secondary | ICD-10-CM

## 2013-12-21 MED ORDER — FLECAINIDE ACETATE 100 MG PO TABS
100.0000 mg | ORAL_TABLET | Freq: Two times a day (BID) | ORAL | Status: DC
Start: 2013-12-21 — End: 2015-01-25

## 2014-11-12 ENCOUNTER — Ambulatory Visit: Payer: Federal, State, Local not specified - PPO | Admitting: Internal Medicine

## 2014-11-17 ENCOUNTER — Encounter: Payer: Self-pay | Admitting: Internal Medicine

## 2014-11-17 ENCOUNTER — Ambulatory Visit (INDEPENDENT_AMBULATORY_CARE_PROVIDER_SITE_OTHER): Payer: Federal, State, Local not specified - PPO | Admitting: Internal Medicine

## 2014-11-17 VITALS — BP 102/72 | HR 68 | Ht 70.0 in | Wt 197.0 lb

## 2014-11-17 DIAGNOSIS — G909 Disorder of the autonomic nervous system, unspecified: Secondary | ICD-10-CM

## 2014-11-17 DIAGNOSIS — I480 Paroxysmal atrial fibrillation: Secondary | ICD-10-CM

## 2014-11-17 NOTE — Patient Instructions (Signed)
Your physician recommends that you continue on your current medications as directed. Please refer to the Current Medication list given to you today.  Your physician wants you to follow-up in: 1 year with Dr. Taylor.  You will receive a reminder letter in the mail two months in advance. If you don't receive a letter, please call our office to schedule the follow-up appointment.  

## 2014-11-17 NOTE — Assessment & Plan Note (Signed)
She is maintaining NSR very nicely. She will continue her current meds. 

## 2014-11-17 NOTE — Assessment & Plan Note (Signed)
We discussed the benign nature of the problem. She is encouraged to maintain an adequate fluid bolus.

## 2014-11-17 NOTE — Progress Notes (Signed)
HPI Heather Keith returns today for followup. She is a very pleasant 64 year old woman with a history of paroxysmal atrial fibrillation. She also has vagal spells with multiple episodes of near syncope. Her palpitations have been well-controlled in the past year. She has been retired for over 2 years. She is still walking 1- 2 miles a day.  She denies chest pain or shortness of breath. No peripheral edema or syncope. She has rare palpitations. She has not had any symptomatic atrial fib in the past year. Allergies  Allergen Reactions  . Penicillins Rash     Current Outpatient Prescriptions  Medication Sig Dispense Refill  . acetaminophen (TYLENOL) 500 MG tablet Take 500 mg by mouth every 6 (six) hours as needed.    Marland Kitchen. aspirin 81 MG tablet Take 81 mg by mouth daily.    Marland Kitchen. atenolol (TENORMIN) 25 MG tablet TAKE 1 TABLET BY MOUTH ONCE DAILY 90 tablet 3  . diclofenac-misoprostol (ARTHROTEC 75) 75-200 MG-MCG per tablet Take 1 tablet by mouth as needed.    . flecainide (TAMBOCOR) 100 MG tablet Take 1 tablet (100 mg total) by mouth 2 (two) times daily. 60 tablet 5  . Multiple Vitamins-Minerals (MULTIVITAMIN WITH MINERALS) tablet Take 1 tablet by mouth daily.    . simvastatin (ZOCOR) 20 MG tablet Take 20 mg by mouth daily.     Marland Kitchen. zolpidem (AMBIEN) 5 MG tablet Take 5 mg by mouth at bedtime as needed.     No current facility-administered medications for this visit.     Past Medical History  Diagnosis Date  . HTN (hypertension)   . AF (atrial fibrillation)   . Palpitations     Rare  . Vasovagal syncope 04/28/13  . Neck pain     ROS:   All systems reviewed and negative except as noted in the HPI.   Past Surgical History  Procedure Laterality Date  . Knee surgery       No family history on file.   History   Social History  . Marital Status: Widowed    Spouse Name: N/A  . Number of Children: N/A  . Years of Education: N/A   Occupational History  . Not on file.   Social History  Main Topics  . Smoking status: Never Smoker   . Smokeless tobacco: Not on file  . Alcohol Use: Not on file  . Drug Use: Not on file  . Sexual Activity: Not on file   Other Topics Concern  . Not on file   Social History Narrative     BP 102/72 mmHg  Pulse 68  Ht 5\' 10"  (1.778 m)  Wt 197 lb (89.359 kg)  BMI 28.27 kg/m2  Physical Exam:  Well appearing middle-age woman,NAD HEENT: Unremarkable Neck:  No JVD, no thyromegally Lungs:  Clear with no wheezes, rales, or rhonchi. HEART:  Regular rate rhythm, no murmurs, no rubs, no clicks Abd:  soft, positive bowel sounds, no organomegally, no rebound, no guarding Ext:  2 plus pulses, no edema, no cyanosis, no clubbing Skin:  No rashes no nodules Neuro:  CN II through XII intact, motor grossly intact  EKG Normal sinus rhythm with nonspecific T wave abnormality and first degree AV block.  Assess/Plan:

## 2014-12-01 ENCOUNTER — Other Ambulatory Visit: Payer: Self-pay | Admitting: *Deleted

## 2014-12-01 MED ORDER — ATENOLOL 25 MG PO TABS: ORAL_TABLET | ORAL | Status: DC

## 2015-01-25 ENCOUNTER — Other Ambulatory Visit: Payer: Self-pay

## 2015-01-25 DIAGNOSIS — I4891 Unspecified atrial fibrillation: Secondary | ICD-10-CM

## 2015-01-25 MED ORDER — FLECAINIDE ACETATE 100 MG PO TABS: 100.0000 mg | ORAL_TABLET | Freq: Two times a day (BID) | ORAL | Status: DC

## 2015-04-20 ENCOUNTER — Encounter: Payer: Self-pay | Admitting: Internal Medicine

## 2015-04-20 ENCOUNTER — Encounter: Payer: Self-pay | Admitting: *Deleted

## 2015-04-20 ENCOUNTER — Ambulatory Visit (INDEPENDENT_AMBULATORY_CARE_PROVIDER_SITE_OTHER): Payer: Federal, State, Local not specified - PPO | Admitting: Internal Medicine

## 2015-04-20 VITALS — BP 98/62 | HR 110 | Ht 70.0 in | Wt 197.2 lb

## 2015-04-20 DIAGNOSIS — I483 Typical atrial flutter: Secondary | ICD-10-CM

## 2015-04-20 DIAGNOSIS — I4892 Unspecified atrial flutter: Secondary | ICD-10-CM | POA: Diagnosis not present

## 2015-04-20 DIAGNOSIS — I480 Paroxysmal atrial fibrillation: Secondary | ICD-10-CM | POA: Diagnosis not present

## 2015-04-20 DIAGNOSIS — I10 Essential (primary) hypertension: Secondary | ICD-10-CM | POA: Diagnosis not present

## 2015-04-20 DIAGNOSIS — G909 Disorder of the autonomic nervous system, unspecified: Secondary | ICD-10-CM

## 2015-04-20 LAB — CBC WITH DIFFERENTIAL/PLATELET
BASOS PCT: 0.5 % (ref 0.0–3.0)
Basophils Absolute: 0 10*3/uL (ref 0.0–0.1)
EOS ABS: 0.1 10*3/uL (ref 0.0–0.7)
Eosinophils Relative: 0.9 % (ref 0.0–5.0)
HCT: 46.2 % — ABNORMAL HIGH (ref 36.0–46.0)
Hemoglobin: 15.2 g/dL — ABNORMAL HIGH (ref 12.0–15.0)
Lymphocytes Relative: 37.6 % (ref 12.0–46.0)
Lymphs Abs: 3.3 10*3/uL (ref 0.7–4.0)
MCHC: 33 g/dL (ref 30.0–36.0)
MCV: 86.2 fl (ref 78.0–100.0)
MONO ABS: 0.8 10*3/uL (ref 0.1–1.0)
MONOS PCT: 9.2 % (ref 3.0–12.0)
Neutro Abs: 4.6 10*3/uL (ref 1.4–7.7)
Neutrophils Relative %: 51.8 % (ref 43.0–77.0)
PLATELETS: 203 10*3/uL (ref 150.0–400.0)
RBC: 5.37 Mil/uL — AB (ref 3.87–5.11)
RDW: 13.6 % (ref 11.5–15.5)
WBC: 8.9 10*3/uL (ref 4.0–10.5)

## 2015-04-20 LAB — BASIC METABOLIC PANEL
BUN: 17 mg/dL (ref 6–23)
CALCIUM: 9.7 mg/dL (ref 8.4–10.5)
CO2: 30 meq/L (ref 19–32)
Chloride: 100 mEq/L (ref 96–112)
Creatinine, Ser: 0.89 mg/dL (ref 0.40–1.20)
GFR: 67.78 mL/min (ref >60.00)
Glucose, Bld: 86 mg/dL (ref 70–99)
Potassium: 4.2 mEq/L (ref 3.5–5.1)
Sodium: 135 mEq/L (ref 135–145)

## 2015-04-20 MED ORDER — APIXABAN 5 MG PO TABS: 5.0000 mg | ORAL_TABLET | Freq: Two times a day (BID) | ORAL | Status: DC

## 2015-04-20 NOTE — Patient Instructions (Signed)
Medication Instructions:  Your physician recommends that you continue on your current medications as directed. Please refer to the Current Medication list given to you today. 1) Start Eliquis 5 mg twice daily  Labwork: Your physician recommends that you return for lab work today: BMP/CBC  Testing/Procedures:  Your physician has recommended that you have a TEE guided Cardioversion (DCCV). Electrical Cardioversion uses a jolt of electricity to your heart either through paddles or wired patches attached to your chest. This is a controlled, usually prescheduled, procedure. Defibrillation is done under light anesthesia in the hospital, and you usually go home the day of the procedure. This is done to get your heart back into a normal rhythm. You are not awake for the procedure. Please see the instruction sheet given to you today.  Your physician has requested that you have a TEE. During a TEE, sound waves are used to create images of your heart. It provides your doctor with information about the size and shape of your heart and how well your heart's chambers and valves are working. In this test, a transducer is attached to the end of a flexible tube that's guided down your throat and into your esophagus (the tube leading from you mouth to your stomach) to get a more detailed image of your heart. You are not awake for the procedure. Please see the instruction sheet given to you today. For further information please visit [email or web address].      Follow-Up:  Your physician recommends that you schedule a follow-up appointment on 04/27/15 with Dr Johney FrameAllred to discuss ablation   Any Other Special Instructions Will Be Listed Below (If Applicable).

## 2015-04-20 NOTE — Progress Notes (Signed)
HPI [name] returns today for followup. She is a very pleasant 64 year old woman with a history of paroxysmal atrial fibrillation. She also has vagal spells with multiple episodes of near syncope. Her palpitations have been well-controlled in the past year. She has been retired for over 2 years. She is still walking.  Approximately 2 days ago, the patient began to experience recurrent palpitations. She denies medical noncompliance. Her heart rates were over 100 bpm. She presents for additional evaluation. She denies dietary changes. She denies using caffeine in excess. Allergies  Allergen Reactions  . Penicillins Rash     Current Outpatient Prescriptions  Medication Sig Dispense Refill  . acetaminophen (TYLENOL) 500 MG tablet Take 500 mg by mouth every 6 (six) hours as needed (pain).     Marland Kitchen. aspirin 81 MG tablet Take 81 mg by mouth daily.    Marland Kitchen. atenolol (TENORMIN) 25 MG tablet TAKE 1 TABLET BY MOUTH ONCE DAILY 90 tablet 3  . diclofenac-misoprostol (ARTHROTEC 75) 75-200 MG-MCG per tablet Take 1 tablet by mouth daily as needed (knee pain).     . flecainide (TAMBOCOR) 100 MG tablet Take 1 tablet (100 mg total) by mouth 2 (two) times daily. 60 tablet 5  . Multiple Vitamins-Minerals (MULTIVITAMIN WITH MINERALS) tablet Take 1 tablet by mouth daily.    . simvastatin (ZOCOR) 20 MG tablet Take 20 mg by mouth daily.     Marland Kitchen. zolpidem (AMBIEN) 10 MG tablet Take 0.5 tablets by mouth at bedtime as needed. sleep    . apixaban (ELIQUIS) 5 MG TABS tablet Take 1 tablet (5 mg total) by mouth 2 (two) times daily. 60 tablet 0   No current facility-administered medications for this visit.     Past Medical History  Diagnosis Date  . HTN (hypertension)   . AF (atrial fibrillation)   . Palpitations     Rare  . Vasovagal syncope 04/28/13  . Neck pain     ROS:   All systems reviewed and negative except as noted in the HPI.   Past Surgical History  Procedure Laterality Date  . Knee surgery        Family History  Problem Relation Age of Onset  . Hodgkin's lymphoma Mother   . Heart attack Father [phone]  . Diabetes Maternal Grandfather      History   Social History  . Marital Status: Widowed    Spouse Name: N/A  . Number of Children: N/A  . Years of Education: N/A   Occupational History  . Not on file.   Social History Main Topics  . Smoking status: Never Smoker   . Smokeless tobacco: Not on file  . Alcohol Use: Not on file  . Drug Use: Not on file  . Sexual Activity: Not on file   Other Topics Concern  . Not on file   Social History Narrative     BP 98/62 mmHg  Pulse 110  Ht 5\' 10"  (1.778 m)  Wt 197 lb 3.2 oz (89.449 kg)  BMI 28.30 kg/m2  Physical Exam:  Unwell appearing middle-age woman,NAD HEENT: Unremarkable Neck:  6 cm JVD, no thyromegally Lungs:  Clear with no wheezes, rales, or rhonchi. HEART:  IRegular tachy rhythm, no murmurs, no rubs, no clicks Abd:  soft, positive bowel sounds, no organomegally, no rebound, no guarding Ext:  2 plus pulses, no edema, no cyanosis, no clubbing Skin:  No rashes no nodules Neuro:  CN II through XII intact, motor grossly intact  EKG - atrial flutter with  a rapid ventricular response, mostly 2-1 AV block.  Assess/Plan:

## 2015-04-20 NOTE — Assessment & Plan Note (Signed)
Her diagnosis of hypertension is actually uncertain. Her blood pressure has always been controlled. She will continue her current medications and maintain a low-sodium diet.

## 2015-04-20 NOTE — Assessment & Plan Note (Signed)
She has had no recurrent syncopal episodes. She will undergo watchful waiting.

## 2015-04-20 NOTE — Assessment & Plan Note (Signed)
The patient has newly diagnosed atrial flutter with 2:1 AV conduction. She has been taking her flecainide. Her atrial fibrillation has been well-controlled. We discussed the treatment options in detail. She will be started on anticoagulation today with Eliquis. She will undergo cardioversion guided by TEE on Friday. I will refer the patient to Dr. Johney FrameAllred for atrial fibrillation and atrial flutter ablation. I note that she has had atrial fibrillation initially diagnosed 15 years ago. She has had fairly nice control of her atrial fibrillation with only a few breakthroughs on flecainide. In addition, the patient will likely require long-term anticoagulation. Previously I had not made her take systemic anticoagulation because her blood pressure was always close to normal and a diagnosis of hypertension was in question. Now that she is approaching 65, she will likely require systemic anticoagulation.

## 2015-04-20 NOTE — Assessment & Plan Note (Signed)
With a combination of both atrial fibrillation and atrial flutter, I would anticipate referring the patient to undergo catheter ablation of both arrhythmias. We would consider stopping her flecainide 6 months after catheter ablation.

## 2015-04-22 ENCOUNTER — Ambulatory Visit (HOSPITAL_COMMUNITY): Payer: Federal, State, Local not specified - PPO | Admitting: Anesthesiology

## 2015-04-22 ENCOUNTER — Encounter (HOSPITAL_COMMUNITY)
Admission: RE | Disposition: A | Discharge status: Home / Self Care | Payer: Federal, State, Local not specified - PPO | Source: Ambulatory Visit | Attending: Cardiology

## 2015-04-22 ENCOUNTER — Encounter (HOSPITAL_COMMUNITY): Payer: Self-pay | Admitting: *Deleted

## 2015-04-22 ENCOUNTER — Ambulatory Visit (HOSPITAL_COMMUNITY)
Admit: 2015-04-22 | Discharge: 2015-04-22 | Disposition: A | Discharge status: Home / Self Care | Payer: Federal, State, Local not specified - PPO | Attending: Cardiology | Admitting: Cardiology

## 2015-04-22 ENCOUNTER — Ambulatory Visit (HOSPITAL_COMMUNITY)
Admission: RE | Admit: 2015-04-22 | Discharge: 2015-04-22 | Disposition: A | Discharge status: Home / Self Care | Payer: Federal, State, Local not specified - PPO | Source: Ambulatory Visit | Attending: Cardiology | Admitting: Cardiology

## 2015-04-22 DIAGNOSIS — R931 Abnormal findings on diagnostic imaging of heart and coronary circulation: Secondary | ICD-10-CM | POA: Insufficient documentation

## 2015-04-22 DIAGNOSIS — I480 Paroxysmal atrial fibrillation: Secondary | ICD-10-CM | POA: Insufficient documentation

## 2015-04-22 DIAGNOSIS — I10 Essential (primary) hypertension: Secondary | ICD-10-CM | POA: Diagnosis not present

## 2015-04-22 DIAGNOSIS — Z79899 Other long term (current) drug therapy: Secondary | ICD-10-CM | POA: Diagnosis not present

## 2015-04-22 DIAGNOSIS — Z7901 Long term (current) use of anticoagulants: Secondary | ICD-10-CM | POA: Diagnosis not present

## 2015-04-22 DIAGNOSIS — I4892 Unspecified atrial flutter: Secondary | ICD-10-CM | POA: Insufficient documentation

## 2015-04-22 HISTORY — PX: TEE WITHOUT CARDIOVERSION: SHX5443

## 2015-04-22 HISTORY — DX: Nausea with vomiting, unspecified: Z98.890

## 2015-04-22 HISTORY — DX: Other specified postprocedural states: R11.2

## 2015-04-22 HISTORY — PX: CARDIOVERSION: SHX1299

## 2015-04-22 SURGERY — ECHOCARDIOGRAM, TRANSESOPHAGEAL
Anesthesia: Monitor Anesthesia Care

## 2015-04-22 MED ORDER — FENTANYL CITRATE (PF) 250 MCG/5ML IJ SOLN
INTRAMUSCULAR | Status: AC
  Filled 2015-04-22: qty 5

## 2015-04-22 MED ORDER — BUTAMBEN-TETRACAINE-BENZOCAINE 2-2-14 % EX AERO
INHALATION_SPRAY | CUTANEOUS | Status: DC | PRN
  Administered 2015-04-22: 2 via TOPICAL

## 2015-04-22 MED ORDER — LIDOCAINE HCL (CARDIAC) 20 MG/ML IV SOLN
INTRAVENOUS | Status: DC | PRN
  Administered 2015-04-22: 100 mg via INTRAVENOUS

## 2015-04-22 MED ORDER — MIDAZOLAM HCL 5 MG/5ML IJ SOLN
INTRAMUSCULAR | Status: DC | PRN
  Administered 2015-04-22: 2 mg via INTRAVENOUS

## 2015-04-22 MED ORDER — MIDAZOLAM HCL 2 MG/2ML IJ SOLN
INTRAMUSCULAR | Status: AC
  Filled 2015-04-22: qty 2

## 2015-04-22 MED ORDER — FENTANYL CITRATE (PF) 100 MCG/2ML IJ SOLN
INTRAMUSCULAR | Status: DC | PRN
  Administered 2015-04-22: 50 ug via INTRAVENOUS

## 2015-04-22 MED ORDER — ONDANSETRON HCL 4 MG/2ML IJ SOLN
INTRAMUSCULAR | Status: DC | PRN
  Administered 2015-04-22: 4 mg via INTRAVENOUS

## 2015-04-22 MED ORDER — PROPOFOL 10 MG/ML IV BOLUS
INTRAVENOUS | Status: AC
  Filled 2015-04-22: qty 20

## 2015-04-22 MED ORDER — PROPOFOL INFUSION 10 MG/ML OPTIME
INTRAVENOUS | Status: DC | PRN
  Administered 2015-04-22: 100 ug/kg/min via INTRAVENOUS

## 2015-04-22 MED ORDER — LACTATED RINGERS IV SOLN
INTRAVENOUS | Status: DC
  Administered 2015-04-22: 08:00:00 via INTRAVENOUS

## 2015-04-22 NOTE — Progress Notes (Signed)
TEE with inconclusive r/o of clot...tissue noted at appendage which is unidentifiable . Dr. Eloy End into procedure room to provide 2nd opinon to Dr. Kym Groom. Plan to hold Cardioversion and to obtain CT to r/o clot.

## 2015-04-22 NOTE — Progress Notes (Signed)
  Echocardiogram Echocardiogram Transesophageal has been performed.  Tye Savoy 04/22/2015, 10:19 AM

## 2015-04-22 NOTE — Anesthesia Procedure Notes (Signed)
Procedure Name: MAC Date/Time: 04/22/2015 8:03 AM Performed by: Jeani Hawking Pre-anesthesia Checklist: Patient identified, Timeout performed, Emergency Drugs available, Suction available and Patient being monitored Patient Re-evaluated:Patient Re-evaluated prior to inductionOxygen Delivery Method: Simple face mask Intubation Type: IV induction Number of attempts: 1 Dental Injury: Teeth and Oropharynx as per pre-operative assessment

## 2015-04-22 NOTE — H&P (View-Only) (Signed)
HPI Heather Keith returns today for followup. She is a very pleasant 64 year old woman with a history of paroxysmal atrial fibrillation. She also has vagal spells with multiple episodes of near syncope. Her palpitations have been well-controlled in the past year. She has been retired for over 2 years. She is still walking.  Approximately 2 days ago, the patient began to experience recurrent palpitations. She denies medical noncompliance. Her heart rates were over 100 bpm. She presents for additional evaluation. She denies dietary changes. She denies using caffeine in excess. Allergies  Allergen Reactions  . Penicillins Rash     Current Outpatient Prescriptions  Medication Sig Dispense Refill  . acetaminophen (TYLENOL) 500 MG tablet Take 500 mg by mouth every 6 (six) hours as needed (pain).     Marland Kitchen. aspirin 81 MG tablet Take 81 mg by mouth daily.    Marland Kitchen. atenolol (TENORMIN) 25 MG tablet TAKE 1 TABLET BY MOUTH ONCE DAILY 90 tablet 3  . diclofenac-misoprostol (ARTHROTEC 75) 75-200 MG-MCG per tablet Take 1 tablet by mouth daily as needed (knee pain).     . flecainide (TAMBOCOR) 100 MG tablet Take 1 tablet (100 mg total) by mouth 2 (two) times daily. 60 tablet 5  . Multiple Vitamins-Minerals (MULTIVITAMIN WITH MINERALS) tablet Take 1 tablet by mouth daily.    . simvastatin (ZOCOR) 20 MG tablet Take 20 mg by mouth daily.     Marland Kitchen. zolpidem (AMBIEN) 10 MG tablet Take 0.5 tablets by mouth at bedtime as needed. sleep    . apixaban (ELIQUIS) 5 MG TABS tablet Take 1 tablet (5 mg total) by mouth 2 (two) times daily. 60 tablet 0   No current facility-administered medications for this visit.     Past Medical History  Diagnosis Date  . HTN (hypertension)   . AF (atrial fibrillation)   . Palpitations     Rare  . Vasovagal syncope 04/28/13  . Neck pain     ROS:   All systems reviewed and negative except as noted in the HPI.   Past Surgical History  Procedure Laterality Date  . Knee surgery        Family History  Problem Relation Age of Onset  . Hodgkin's lymphoma Mother   . Heart attack Father [phone]  . Diabetes Maternal Grandfather      History   Social History  . Marital Status: Widowed    Spouse Name: N/A  . Number of Children: N/A  . Years of Education: N/A   Occupational History  . Not on file.   Social History Main Topics  . Smoking status: Never Smoker   . Smokeless tobacco: Not on file  . Alcohol Use: Not on file  . Drug Use: Not on file  . Sexual Activity: Not on file   Other Topics Concern  . Not on file   Social History Narrative     BP 98/62 mmHg  Pulse 110  Ht 5\' 10"  (1.778 m)  Wt 197 lb 3.2 oz (89.449 kg)  BMI 28.30 kg/m2  Physical Exam:  Unwell appearing middle-age woman,NAD HEENT: Unremarkable Neck:  6 cm JVD, no thyromegally Lungs:  Clear with no wheezes, rales, or rhonchi. HEART:  IRegular tachy rhythm, no murmurs, no rubs, no clicks Abd:  soft, positive bowel sounds, no organomegally, no rebound, no guarding Ext:  2 plus pulses, no edema, no cyanosis, no clubbing Skin:  No rashes no nodules Neuro:  CN II through XII intact, motor grossly intact  EKG - atrial flutter with  a rapid ventricular response, mostly 2-1 AV block.  Assess/Plan: 

## 2015-04-22 NOTE — Interval H&P Note (Signed)
History and Physical Interval Note:  04/22/2015 8:01 AM  Heather Keith  has presented today for surgery, with the diagnosis of AFLUTTER  The various methods of treatment have been discussed with the patient and family. After consideration of risks, benefits and other options for treatment, the patient has consented to  Procedure(s): TRANSESOPHAGEAL ECHOCARDIOGRAM (TEE) (N/A) CARDIOVERSION (N/A) as a surgical intervention .  The patient's history has been reviewed, patient examined, no change in status, stable for surgery.  I have reviewed the patient's chart and labs.  Questions were answered to the patient's satisfaction.     [name] R

## 2015-04-22 NOTE — Anesthesia Postprocedure Evaluation (Signed)
  Anesthesia Post-op Note  Patient: Heather Keith  Procedure(s) Performed: Procedure(s): TRANSESOPHAGEAL ECHOCARDIOGRAM (TEE) (N/A) CARDIOVERSION (N/A)  Patient Location: PACU  Anesthesia Type:MAC  Level of Consciousness: awake and alert   Airway and Oxygen Therapy: Patient Spontanous Breathing  Post-op Pain: none  Post-op Assessment: Post-op Vital signs reviewed              Post-op Vital Signs: Reviewed  Last Vitals:  Filed Vitals:   04/22/15 0845  BP:   Pulse: 97  Temp:   Resp: 13    Complications: No apparent anesthesia complications

## 2015-04-22 NOTE — Anesthesia Preprocedure Evaluation (Signed)
Anesthesia Evaluation   Patient awake    Reviewed: Allergy & Precautions, NPO status , Patient's Chart, lab work & pertinent test results  History of Anesthesia Complications (+) PONV  Airway Mallampati: II  TM Distance: >3 FB Neck ROM: Full    Dental   Pulmonary neg pulmonary ROS,  breath sounds clear to auscultation        Cardiovascular hypertension, Pt. on medications + dysrhythmias Atrial Fibrillation Rhythm:Irregular Rate:Normal     Neuro/Psych negative neurological ROS     GI/Hepatic negative GI ROS, Neg liver ROS,   Endo/Other  negative endocrine ROS  Renal/GU negative Renal ROS     Musculoskeletal   Abdominal   Peds  Hematology negative hematology ROS (+)   Anesthesia Other Findings   Reproductive/Obstetrics                             Anesthesia Physical Anesthesia Plan  ASA: II  Anesthesia Plan: MAC   Post-op Pain Management:    Induction: Intravenous  Airway Management Planned: Natural Airway and Nasal Cannula  Additional Equipment:   Intra-op Plan:   Post-operative Plan:   Informed Consent: I have reviewed the patients History and Physical, chart, labs and discussed the procedure including the risks, benefits and alternatives for the proposed anesthesia with the patient or authorized representative who has indicated his/her understanding and acceptance.     Plan Discussed with: CRNA  Anesthesia Plan Comments:         Anesthesia Quick Evaluation

## 2015-04-22 NOTE — Discharge Instructions (Signed)
Transesophageal Echocardiogram °Transesophageal echocardiography (TEE) is a picture test of your heart using sound waves. The pictures taken can give very detailed pictures of your heart. This can help your doctor see if there are problems with your heart. TEE can check: °· If your heart has blood clots in it. °· How well your heart valves are working. °· If you have an infection on the inside of your heart. °· Some of the major arteries of your heart. °· If your heart valve is working after a repair. °· Your heart before a procedure that uses a shock to your heart to get the rhythm back to normal. °BEFORE THE PROCEDURE °· Do not eat or drink for 6 hours before the procedure or as told by your doctor. °· Make plans to have someone drive you home after the procedure. Do not drive yourself home. °· An IV tube will be put in your arm. °PROCEDURE °· You will be given a medicine to help you relax (sedative). It will be given through the IV tube. °· A numbing medicine will be sprayed or gargled in the back of your throat to help numb it. °· The tip of the probe is placed into the back of your mouth. You will be asked to swallow. This helps to pass the probe into your esophagus. °· Once the tip of the probe is in the right place, your doctor can take pictures of your heart. °· You may feel pressure at the back of your throat. °AFTER THE PROCEDURE °· You will be taken to a recovery area so the sedative can wear off. °· Your throat may be sore and scratchy. This will go away slowly over time. °· You will go home when you are fully awake and able to swallow liquids. °· You should have someone stay with you for the next 24 hours. °· Do not drive or operate machinery for the next 24 hours. °Document Released: 07/15/2009 Document Revised: 09/22/2013 Document Reviewed: 03/19/2013 °[employer]® Patient Information ©2015 [employer]. This information is not intended to replace advice given to you by your health care provider. Make  sure you discuss any questions you have with your health care provider. ° °

## 2015-04-22 NOTE — CV Procedure (Signed)
PROCEDURE NOTE  Procedure:  Transesophageal echocardiogram  Operator:  Armanda Magic, MD Indications:  Atrial fibrillation/flutter Complications:  None IV Meds:   Propofol, Fentayl,  Versed,  Zofran and  Lidocaine.  Results: Normal LV size and function Normal RV size and function Mildly dilated RA Mild to moderately dilated LA with no evidence of spontaneous echo contrast. The LA appendage is large and multilobed.  There is a very mobile large density in the apex of the appendage that appears in some views to be attached to the wall of the appendage.  This is of unknown etiology and cannot rule out that this is a thrombus. 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   Normal thoracic and ascending aorta  Due to mobile density in the LA appendage of unknown etiology, DCCV was not performed.  This was reviewed with Dr. Delton See and the decision was made to refer patient for cardiac CT next week for further evaluation of the LA appendage.    The patient was transferred back to her room in stable condition.  Ailine Hefferan R 04/22/2015, 8:03 AM

## 2015-04-22 NOTE — Progress Notes (Signed)
Pt in a SR after presenting in a Afib.Marland Kitchen..Marland Kitchenrechecked in second lead setting and Aflutter was present.  CRNA also confirmed changes...12 lead obtained.

## 2015-04-22 NOTE — Transfer of Care (Signed)
Immediate Anesthesia Transfer of Care Note  Patient: Heather Keith  Procedure(s) Performed: Procedure(s): TRANSESOPHAGEAL ECHOCARDIOGRAM (TEE) (N/A) CARDIOVERSION (N/A)  Patient Location: Endoscopy Unit  Anesthesia Type:MAC  Level of Consciousness: awake, alert , oriented and patient cooperative  Airway & Oxygen Therapy: Patient Spontanous Breathing and Patient connected to nasal cannula oxygen  Post-op Assessment: Report given to RN and Post -op Vital signs reviewed and stable  Post vital signs: Reviewed and stable  Last Vitals:  Filed Vitals:   04/22/15 0808  BP: 141/89  Pulse: 98  Temp:   Resp: 13    Complications: No apparent anesthesia complications

## 2015-04-22 NOTE — Addendum Note (Signed)
Addendum  created 04/22/15 5409 by Andree Elk, CRNA   Modules edited: Anesthesia Attestations

## 2015-04-23 ENCOUNTER — Telehealth: Payer: Self-pay | Admitting: Cardiology

## 2015-04-23 NOTE — Telephone Encounter (Signed)
Patient called stating that her heart beat is irregular and weak and pale.  Her heart rate has been 110 - 120 bpm with SBP 80-170mmHg.  Instructed patient to go to [facility] ER to be evaluated.

## 2015-04-25 ENCOUNTER — Telehealth: Payer: Self-pay | Admitting: Cardiology

## 2015-04-25 DIAGNOSIS — R931 Abnormal findings on diagnostic imaging of heart and coronary circulation: Secondary | ICD-10-CM

## 2015-04-25 NOTE — Telephone Encounter (Signed)
New Message      Pt calling stating that Dr. Mayford Knife wanted her to have a CT done, there isn't one scheduled and there is no order in Epic. Please call back and advise.

## 2015-04-25 NOTE — Telephone Encounter (Signed)
Please order this and put on the order LA appendage mass rule out thrombus and attn Dr. Delton See to read

## 2015-04-25 NOTE — Telephone Encounter (Signed)
Please have her come in for EKG to document

## 2015-04-25 NOTE — Telephone Encounter (Signed)
Informed patient that cardiac CT is ordered but it may take a day or two for pre-cert before [facility] can call to schedule.  Patient wants Dr. Mayford Knife to know that yesterday afternoon, she "felt like she fell off a cliff" and converted to NSR. She has been in a regular rhythm with HR in 60s since. Her BP this morning was 118/69. She feels much better.

## 2015-04-25 NOTE — Telephone Encounter (Signed)
Patient unable to drive on her own. She has an OV with Dr. Johney Frame scheduled Wednesday and will have EKG then.

## 2015-04-26 ENCOUNTER — Encounter (HOSPITAL_COMMUNITY): Payer: Self-pay | Admitting: Cardiology

## 2015-04-27 ENCOUNTER — Other Ambulatory Visit: Payer: Self-pay

## 2015-04-27 ENCOUNTER — Encounter: Payer: Self-pay | Admitting: Internal Medicine

## 2015-04-27 ENCOUNTER — Ambulatory Visit (INDEPENDENT_AMBULATORY_CARE_PROVIDER_SITE_OTHER): Payer: Federal, State, Local not specified - PPO | Admitting: Internal Medicine

## 2015-04-27 VITALS — BP 122/70 | HR 60 | Ht 70.0 in | Wt 199.4 lb

## 2015-04-27 DIAGNOSIS — I480 Paroxysmal atrial fibrillation: Secondary | ICD-10-CM | POA: Diagnosis not present

## 2015-04-27 DIAGNOSIS — I483 Typical atrial flutter: Secondary | ICD-10-CM | POA: Diagnosis not present

## 2015-04-27 NOTE — Progress Notes (Signed)
Electrophysiology Office Note   Date:  04/27/2015   ID:  Heather Keith, Heather Keith 1951/08/22, MRN [id number]  PCP:  Josue Hector, MD  Primary Electrophysiologist: Dr Ladona Ridgel  Chief Complaint  Patient presents with  . PAF  . Atrial Flutter     History of Present Illness: Heather Keith is a 64 y.o. female who presents today for electrophysiology follow-up.   The patient has had atrial fibrillation for years but has had very good control with flecainide.  2 weeks ago, she developed abrupt onset of atrial flutter after heavy exercise (much heavier exercise than usual).  She thinks that she "over did it".  She was evaluated by Dr Ladona Ridgel and noted to have atrial flutter.  She was therefore scheduled for TEE guided cardioversion and initiated on eliquis.  Her TEE was performed by Dr Mayford Knife and she was felt to have "something" in her left atrial appendage.  Cardioversion was not performed.  She has been encouraged to have a cardiac CT to better evaluate the LA appendage though this has not yet been performed. She spontaneously converted to sinus rhythm this past weekend.  She has returned to baseline health state and is currently pleased with her condition.  Today, she denies symptoms of palpitations, chest pain, shortness of breath, orthopnea, PND, lower extremity edema, claudication, dizziness, presyncope, syncope, bleeding, or neurologic sequela. The patient is tolerating medications without difficulties and is otherwise without complaint today.    Past Medical History  Diagnosis Date  . HTN (hypertension)   . Paroxysmal a-fib   . Typical atrial flutter   . Vasovagal syncope 04/28/13  . Neck pain   . PONV (postoperative nausea and vomiting)     and had a siezure once at dentist    Past Surgical History  Procedure Laterality Date  . Knee surgery    . Tee without cardioversion N/A 04/22/2015    Procedure: TRANSESOPHAGEAL ECHOCARDIOGRAM (TEE);  Surgeon: Quintella Reichert, MD;   Location: [facility] ENDOSCOPY;  Service: Cardiovascular;  Laterality: N/A;  . Cardioversion N/A 04/22/2015    Procedure: CARDIOVERSION;  Surgeon: Quintella Reichert, MD;  Location: [facility] ENDOSCOPY;  Service: Cardiovascular;  Laterality: N/A;     Current Outpatient Prescriptions  Medication Sig Dispense Refill  . acetaminophen (TYLENOL) 500 MG tablet Take 500 mg by mouth every 6 (six) hours as needed (pain).     Marland Kitchen apixaban (ELIQUIS) 5 MG TABS tablet Take 1 tablet (5 mg total) by mouth 2 (two) times daily. 60 tablet 0  . aspirin 81 MG tablet Take 81 mg by mouth daily.    Marland Kitchen atenolol (TENORMIN) 25 MG tablet TAKE 1 TABLET BY MOUTH ONCE DAILY 90 tablet 3  . diclofenac (VOLTAREN) 75 MG EC tablet Take 1 tablet by mouth daily as needed. pain    . diclofenac-misoprostol (ARTHROTEC 75) 75-200 MG-MCG per tablet Take 1 tablet by mouth daily as needed (knee pain).     . flecainide (TAMBOCOR) 100 MG tablet Take 1 tablet (100 mg total) by mouth 2 (two) times daily. 60 tablet 5  . Multiple Vitamins-Minerals (MULTIVITAMIN WITH MINERALS) tablet Take 1 tablet by mouth daily.    . simvastatin (ZOCOR) 20 MG tablet Take 20 mg by mouth daily.     Marland Kitchen zolpidem (AMBIEN) 10 MG tablet Take 0.5 tablets by mouth at bedtime as needed. sleep     No current facility-administered medications for this visit.    Allergies:   Penicillins   Social History:  The patient  reports that she has never smoked. She does not have any smokeless tobacco history on file. She reports that she drinks alcohol. She reports that she does not use illicit drugs.   Family History:  The patient's  family history includes Diabetes in her maternal grandfather; Heart attack (age of onset: 30) in her father; Hodgkin's lymphoma in her mother.    ROS:  Please see the history of present illness.   All other systems are reviewed and negative.    PHYSICAL EXAM: VS:  BP 122/70 mmHg  Pulse 60  Ht 5\' 10"  (1.778 m)  Wt 90.447 kg (199 lb 6.4 oz)  BMI 28.61 kg/m2 ,  BMI Body mass index is 28.61 kg/(m^2). GEN: Well nourished, well developed, in no acute distress HEENT: normal Neck: no JVD, carotid bruits, or masses Cardiac: RRR; no murmurs, rubs, or gallops,no edema  Respiratory:  clear to auscultation bilaterally, normal work of breathing GI: soft, nontender, nondistended, + BS MS: no deformity or atrophy Skin: warm and dry  Neuro:  Strength and sensation are intact Psych: euthymic mood, full affect  EKG:  EKG is ordered today. The ekg ordered today shows sinus rhythm   Recent Labs: 04/20/2015: BUN 17; Creatinine, Ser 0.89; Hemoglobin 15.2*; Platelets 203.0; Potassium 4.2; Sodium 135    Lipid Panel  No results found for: CHOL, TRIG, HDL, CHOLHDL, VLDL, LDLCALC, LDLDIRECT   Wt Readings from Last 3 Encounters:  04/27/15 90.447 kg (199 lb 6.4 oz)  04/20/15 89.449 kg (197 lb 3.2 oz)  11/17/14 89.359 kg (197 lb)      Other studies Reviewed: Additional studies/ records that were reviewed today include: TEE, Dr Lubertha Basque notes   ASSESSMENT AND PLAN:  1.  Paroxysmal atrial fibrillation and atrial flutter The patient has done well for a long time with flecainide but more recently has developed symptomatic atrial flutter. As episodes have been infrequent, I think that our options would be watchful waiting or ablation.  I would agree with Dr Ladona Ridgel that PVI + CTI would be preferred over CTI alone.  In addition, I doubt that trying a different AAD would offer much value.   The next most important step is to better clarify the finding on TEE within her LA appendage.  IF this is a thrombus then additional anticoagulation would be required and TEE subsequently repeated prior to ablation.  Given her relatively short course of atrial flutter, low risk profile, and anticoagulation, it seems very unlikely that she would haves thrombus. Cardiac CT is ordered to better evaluate this issue. Once Cardiac CT is obtained, we can better decide whether to proceed  with ablation or watchful waiting.  Today, I have spent 25 minutes with the patient discussing afib management .  More than 50% of the visit time today was spent on this issue.     Randolm Idol, MD  04/27/2015 10:01 PM     [facility] [address] [place] [address] [phone] (office) [phone] (fax)

## 2015-04-27 NOTE — Patient Instructions (Signed)
Medication Instructions:  Your physician recommends that you continue on your current medications as directed. Please refer to the Current Medication list given to you today.   Labwork: None ordered   Testing/Procedures: Cardiac CT needs to be scheduled  Order was placed yesterday by Dr Mayford Knife  Follow-Up:  Will follow up after CT.  If CT okay can proceed with Ablation  Any Other Special Instructions Will Be Listed Below (If Applicable).

## 2015-04-29 ENCOUNTER — Encounter: Payer: Self-pay | Admitting: Cardiology

## 2015-05-05 ENCOUNTER — Encounter (HOSPITAL_COMMUNITY): Payer: Self-pay

## 2015-05-05 ENCOUNTER — Ambulatory Visit (HOSPITAL_COMMUNITY)
Admission: RE | Admit: 2015-05-05 | Discharge: 2015-05-05 | Disposition: A | Discharge status: Home / Self Care | Payer: Federal, State, Local not specified - PPO | Source: Ambulatory Visit | Attending: Cardiology | Admitting: Cardiology

## 2015-05-05 DIAGNOSIS — R931 Abnormal findings on diagnostic imaging of heart and coronary circulation: Secondary | ICD-10-CM | POA: Insufficient documentation

## 2015-05-05 MED ORDER — NITROGLYCERIN 0.4 MG SL SUBL
0.4000 mg | SUBLINGUAL_TABLET | SUBLINGUAL | Status: DC | PRN
  Administered 2015-05-05: 0.4 mg via SUBLINGUAL
  Filled 2015-05-05: qty 25

## 2015-05-05 MED ORDER — IOHEXOL 350 MG/ML SOLN
80.0000 mL | Freq: Once | INTRAVENOUS | Status: AC | PRN
  Administered 2015-05-05: 80 mL via INTRAVENOUS

## 2015-05-05 MED ORDER — NITROGLYCERIN 0.4 MG SL SUBL
SUBLINGUAL_TABLET | SUBLINGUAL | Status: AC
  Filled 2015-05-05: qty 1

## 2015-05-16 ENCOUNTER — Other Ambulatory Visit: Payer: Self-pay | Admitting: Internal Medicine

## 2015-05-19 ENCOUNTER — Telehealth: Payer: Self-pay | Admitting: *Deleted

## 2015-05-19 NOTE — Telephone Encounter (Signed)
Called patient per Dr Jenel Lucks request to set up fib/flutter ablation as her CT scan was good.  She says she has a vacation scheduled for this month and next and has a follow up scheduled for Dr Johney Frame in Oct.  She is going to wait on the ablation for now.  She will call back if she has another episode prior to her follow up appointment.

## 2015-05-25 ENCOUNTER — Ambulatory Visit: Payer: Federal, State, Local not specified - PPO | Admitting: Internal Medicine

## 2015-06-15 ENCOUNTER — Other Ambulatory Visit: Payer: Self-pay | Admitting: Internal Medicine

## 2015-06-27 ENCOUNTER — Ambulatory Visit: Payer: Federal, State, Local not specified - PPO | Admitting: Internal Medicine

## 2015-07-04 ENCOUNTER — Encounter: Payer: Self-pay | Admitting: Internal Medicine

## 2015-07-04 ENCOUNTER — Ambulatory Visit (INDEPENDENT_AMBULATORY_CARE_PROVIDER_SITE_OTHER): Payer: Federal, State, Local not specified - PPO | Admitting: Internal Medicine

## 2015-07-04 VITALS — BP 128/80 | HR 63 | Ht 70.0 in | Wt 204.0 lb

## 2015-07-04 DIAGNOSIS — I480 Paroxysmal atrial fibrillation: Secondary | ICD-10-CM

## 2015-07-04 DIAGNOSIS — I483 Typical atrial flutter: Secondary | ICD-10-CM | POA: Diagnosis not present

## 2015-07-04 NOTE — Progress Notes (Signed)
Electrophysiology Office Note   Date:  07/04/2015   ID:  Heather Keith, DOB 02-27-1951, MRN [id number]  PCP:  Josue Hector, MD  Primary Electrophysiologist: Dr Ladona Ridgel  Chief Complaint  Patient presents with  . Atrial Flutter  . PAF     History of Present Illness: Heather Keith is a 64 y.o. female who presents today for follow-up.   She continues to feel well, has not had any palpitations or PAFib/flutter since her last visit, and reports feeling like the Eliquis made her feel poorly with heat intolerances, some GI upset, fatigue/weakness, she self stopped it 6 days ago with resolution of all of her symptoms and feeling very well.  She is s/p coronary CT.  Today, she denies symptoms of palpitations, chest pain, shortness of breath, orthopnea, PND, lower extremity edema, claudication, dizziness, presyncope, syncope, bleeding, or neurologic sequela. The patient is tolerating medications without difficulties and is otherwise without complaint today.    Past Medical History  Diagnosis Date  . HTN (hypertension)     (patient denies)  . Paroxysmal a-fib (HCC)     CHADS2vasc =1  . Typical atrial flutter (HCC)   . Vasovagal syncope 04/28/13  . Neck pain   . PONV (postoperative nausea and vomiting)     and had a siezure once at dentist    Past Surgical History  Procedure Laterality Date  . Knee surgery    . Tee without cardioversion N/A 04/22/2015    Procedure: TRANSESOPHAGEAL ECHOCARDIOGRAM (TEE);  Surgeon: Quintella Reichert, MD;  Location: [facility] ENDOSCOPY;  Service: Cardiovascular;  Laterality: N/A;  . Cardioversion N/A 04/22/2015    Procedure: CARDIOVERSION;  Surgeon: Quintella Reichert, MD;  Location: [facility] ENDOSCOPY;  Service: Cardiovascular;  Laterality: N/A;     Current Outpatient Prescriptions  Medication Sig Dispense Refill  . acetaminophen (TYLENOL) 500 MG tablet Take 500 mg by mouth every 6 (six) hours as needed (pain).     Marland Kitchen aspirin 81 MG tablet Take 162 mg by  mouth daily.     Marland Kitchen atenolol (TENORMIN) 25 MG tablet TAKE 1 TABLET BY MOUTH ONCE DAILY 90 tablet 3  . diclofenac (VOLTAREN) 75 MG EC tablet Take 1 tablet by mouth daily as needed. pain    . flecainide (TAMBOCOR) 100 MG tablet Take 1 tablet (100 mg total) by mouth 2 (two) times daily. 60 tablet 5  . Multiple Vitamins-Minerals (MULTIVITAMIN WITH MINERALS) tablet Take 1 tablet by mouth daily.    . simvastatin (ZOCOR) 20 MG tablet Take 20 mg by mouth daily.     Marland Kitchen zolpidem (AMBIEN) 10 MG tablet Take 0.5 tablets by mouth at bedtime as needed. sleep     No current facility-administered medications for this visit.    Allergies:   Penicillins   Social History:  The patient  reports that she has never smoked. She does not have any smokeless tobacco history on file. She reports that she drinks alcohol. She reports that she does not use illicit drugs.   Family History:  The patient's  family history includes Diabetes in her maternal grandfather; Heart attack (age of onset: 53) in her father; Hodgkin's lymphoma in her mother.    ROS:  Please see the history of present illness.   All other systems are reviewed and negative.    PHYSICAL EXAM: VS:  BP 128/80 mmHg  Pulse 63  Ht  (1.778 m)  Wt 204 lb (92.534 kg)  BMI 29.27 kg/m2 , BMI Body mass index is  29.27 kg/(m^2). GEN: Well nourished, well developed, in no acute distress HEENT: normal Neck: no JVD, carotid bruits, or masses Cardiac: RRR; no murmurs, rubs, or gallops,no edema  Respiratory:  clear to auscultation bilaterally, normal work of breathing GI: soft, nontender, nondistended, + BS MS: no deformity or atrophy Skin: warm and dry  Neuro:  Strength and sensation are intact Psych: euthymic mood, full affect  EKG:  EKG is ordered today. The ekg ordered today shows sinus rhythm, 1st degree AVBlock   Recent Labs: 04/20/2015: BUN 17; Creatinine, Ser 0.89; Hemoglobin 15.2*; Platelets 203.0; Potassium 4.2; Sodium 135    Lipid Panel    No results found for: CHOL, TRIG, HDL, CHOLHDL, VLDL, LDLCALC, LDLDIRECT   Wt Readings from Last 3 Encounters:  07/04/15 204 lb (92.534 kg)  04/27/15 199 lb 6.4 oz (90.447 kg)  04/20/15 197 lb 3.2 oz (89.449 kg)      Other studies Reviewed:  Coronary CT 05/05/15 IMPRESSION: 1) Normal Right dominant coronary arteries  2) Coronary calcium score of 0  3) Normal LAA with no thrombus  4) Normal pulmonary veins all originating from the LA  5) Mild RA/RV enlargement   TEE 04/22/15 Study Conclusions - Left ventricle: Systolic function was normal. The estimated ejection fraction was in the range of 55% to 60%. Wall motion was normal; there were no regional wall motion abnormalities. - Aortic valve: There was trivial regurgitation. - Left atrium: There is a mobile density in the LA appendage of unknown etiology, DCCV was not performed. It does appear to be connected to the wall of the appendage. This could represent thrombus but also could be part of a multilobulated appendage. Recommend cardiac CT further evaluation of the LA appendage. The atrium was mildly to moderately dilated. No evidence of thrombus in the atrial cavity or appendage. - Right atrium: The atrium was mildly dilated. No evidence of thrombus in the atrial cavity or appendage. - Atrial septum: There was increased thickness of the septum, consistent with lipomatous hypertrophy. - Pulmonic valve: No evidence of vegetation. - Recommendations: Recommend Cardiac CT to further delineate LA appendage mass.  ASSESSMENT AND PLAN:  1.  Paroxysmal atrial fibrillation and atrial flutter The patient has done well for a long time with flecainide with no further palpitations for months now, she attributed to overdoing it at an exercise class.  We re-discussed ablation therapy but she wants to hold off since she has not felt any further episodes of palpitations.   The patient self stopped her Eliquis,  discussed with her stroke risk, she denies HTN making her CHADS2Vasc =1.  She prefers to stay off anticoagulation at this time. She will f/u with Dr. Ladona Ridgel in 3months, sooner if needed.   Randolm Idol MD  07/04/2015 2:51 PM     [facility] [address] [place] [address] [phone] (office) [phone] (fax)

## 2015-07-04 NOTE — Patient Instructions (Signed)
Medication Instructions:  Your physician recommends that you continue on your current medications as directed. Please refer to the Current Medication list given to you today.   Labwork: None ordered  Testing/Procedures: None ordered  Follow-Up: Your physician recommends that you schedule a follow-up appointment in: 3 months with Dr Taylor   Any Other Special Instructions Will Be Listed Below (If Applicable).   

## 2015-07-15 ENCOUNTER — Other Ambulatory Visit: Payer: Self-pay | Admitting: Internal Medicine

## 2015-08-16 ENCOUNTER — Encounter: Payer: Self-pay | Admitting: Internal Medicine

## 2015-08-16 ENCOUNTER — Ambulatory Visit (INDEPENDENT_AMBULATORY_CARE_PROVIDER_SITE_OTHER): Payer: Federal, State, Local not specified - PPO | Admitting: Internal Medicine

## 2015-08-16 VITALS — BP 118/78 | HR 59 | Ht 70.0 in | Wt 203.6 lb

## 2015-08-16 DIAGNOSIS — I5189 Other ill-defined heart diseases: Secondary | ICD-10-CM

## 2015-08-16 DIAGNOSIS — I513 Intracardiac thrombosis, not elsewhere classified: Secondary | ICD-10-CM

## 2015-08-16 DIAGNOSIS — I480 Paroxysmal atrial fibrillation: Secondary | ICD-10-CM | POA: Diagnosis not present

## 2015-08-16 DIAGNOSIS — I4892 Unspecified atrial flutter: Secondary | ICD-10-CM

## 2015-08-16 HISTORY — DX: Intracardiac thrombosis, not elsewhere classified: I51.3

## 2015-08-16 MED ORDER — METOPROLOL TARTRATE 25 MG PO TABS: 12.5000 mg | ORAL_TABLET | Freq: Two times a day (BID) | ORAL | Status: DC

## 2015-08-16 NOTE — Progress Notes (Signed)
HPI [name] returns today for followup. She is a very pleasant 64 year old woman with a history of paroxysmal atrial fibrillation. She also has vagal spells with multiple episodes of near syncope. Her palpitations have been well-controlled in the past year. She has been retired for over 2 years. She is still walking.  She developed atrial fib which worsened and I referred her to Dr. Fawn KirkJA. She was placed on Eliquis but was intolerant of this medication. She opted not to have an ablation. She has noted some fatigue. No other complaints. Her HR's have been in the 40's and low 50's.  Allergies  Allergen Reactions  . Penicillins Rash     Current Outpatient Prescriptions  Medication Sig Dispense Refill  . acetaminophen (TYLENOL) 500 MG tablet Take 500 mg by mouth every 6 (six) hours as needed (pain).     Marland Kitchen. aspirin 81 MG tablet Take 162 mg by mouth daily.     Marland Kitchen. atenolol (TENORMIN) 25 MG tablet TAKE 1 TABLET BY MOUTH ONCE DAILY 90 tablet 3  . diclofenac (VOLTAREN) 75 MG EC tablet Take 1 tablet by mouth daily as needed for mild pain. pain    . escitalopram (LEXAPRO) 10 MG tablet Take 10 mg by mouth daily.    . flecainide (TAMBOCOR) 100 MG tablet TAKE 1 TABLET BY MOUTH TWICE DAILY 60 tablet 11  . Multiple Vitamins-Minerals (MULTIVITAMIN WITH MINERALS) tablet Take 1 tablet by mouth daily.    Marland Kitchen. omeprazole (PRILOSEC) 20 MG capsule Take 20 mg by mouth daily.    . simvastatin (ZOCOR) 20 MG tablet Take 20 mg by mouth daily.     Marland Kitchen. zolpidem (AMBIEN) 10 MG tablet Take 0.5 tablets by mouth at bedtime as needed. sleep     No current facility-administered medications for this visit.     Past Medical History  Diagnosis Date  . HTN (hypertension)     (patient denies)  . Paroxysmal a-fib (HCC)     CHADS2vasc =1  . Typical atrial flutter (HCC)   . Vasovagal syncope 04/28/13  . Neck pain   . PONV (postoperative nausea and vomiting)     and had a siezure once at dentist     ROS:   All systems reviewed  and negative except as noted in the HPI.   Past Surgical History  Procedure Laterality Date  . Knee surgery    . Tee without cardioversion N/A 04/22/2015    Procedure: TRANSESOPHAGEAL ECHOCARDIOGRAM (TEE);  Surgeon: Quintella Reichertraci R Turner, MD;  [facility];  Service: Cardiovascular;  Laterality: N/A;  . Cardioversion N/A 04/22/2015    Procedure: CARDIOVERSION;  Surgeon: Quintella Reichertraci R Turner, MD;  Location: [facility] ENDOSCOPY;  Service: Cardiovascular;  Laterality: N/A;     Family History  Problem Relation Age of Onset  . Hodgkin's lymphoma Mother   . Heart attack Father [id number]  . Diabetes Maternal Grandfather      Social History   Social History  . Marital Status: Widowed    Spouse Name: N/A  . Number of Children: N/A  . Years of Education: N/A   Occupational History  . Not on file.   Social History Main Topics  . Smoking status: Never Smoker   . Smokeless tobacco: Not on file  . Alcohol Use: Yes     Comment: occasionally  . Drug Use: No  . Sexual Activity: Not on file   Other Topics Concern  . Not on file   Social History Narrative     Ht 5\' 10"  (  1.778 m)  Wt 203 lb 9.6 oz (92.352 kg)  BMI 29.21 kg/m2  Physical Exam:  Unwell appearing middle-age woman,NAD HEENT: Unremarkable Neck:  6 cm JVD, no thyromegally Lungs:  Clear with no wheezes, rales, or rhonchi. HEART:  IRegular tachy rhythm, no murmurs, no rubs, no clicks Abd:  soft, positive bowel sounds, no organomegally, no rebound, no guarding Ext:  2 plus pulses, no edema, no cyanosis, no clubbing Skin:  No rashes no nodules Neuro:  CN II through XII intact, motor grossly intact  EKG - sinus bradycardia  Assess/Plan:

## 2015-08-16 NOTE — Assessment & Plan Note (Signed)
She is maintaining NSR on flecainide and a beta blocker. She will need to go on systemic anti-coagulation when she turns 65. I will see her back then. She did not tolerate Eliquis. Will try Xarelto at that time.

## 2015-08-16 NOTE — Assessment & Plan Note (Signed)
She has had no recurrence. Will follow.

## 2015-08-16 NOTE — Assessment & Plan Note (Signed)
Her blood pressure was 118/75 today. Will follow.

## 2015-08-16 NOTE — Assessment & Plan Note (Signed)
Heather Keith demonstrated a possible thrombus but CT scan of the LA did not. At this point we can conclude she did not have a LAA thrombus.

## 2015-08-16 NOTE — Patient Instructions (Signed)
Medication Instructions:  Your physician has recommended you make the following change in your medication:  1) STOP Atenolol 2) START Metoprolol 12.5 mg twice a day  Labwork: None ordered  Testing/Procedures: None ordered  Follow-Up: Your physician recommends that you schedule a follow-up appointment in: February with Dr. Ladona Ridgelaylor.    If you need a refill on your cardiac medications before your next appointment, please call your pharmacy.  Thank you for choosing [facility]!!

## 2015-11-22 ENCOUNTER — Encounter: Payer: Self-pay | Admitting: Internal Medicine

## 2015-11-22 ENCOUNTER — Ambulatory Visit (INDEPENDENT_AMBULATORY_CARE_PROVIDER_SITE_OTHER): Payer: Federal, State, Local not specified - PPO | Admitting: Internal Medicine

## 2015-11-22 VITALS — BP 122/74 | HR 57 | Ht 70.0 in | Wt 195.6 lb

## 2015-11-22 DIAGNOSIS — I480 Paroxysmal atrial fibrillation: Secondary | ICD-10-CM | POA: Diagnosis not present

## 2015-11-22 NOTE — Patient Instructions (Signed)
Medication Instructions:  Your physician has recommended you make the following change in your medication:  1) DrLadona Ridgelylor recommends starting Xarelto 20 mg daily----30 day free card given--please call Dennis Bast, RN if you decide to take and I will call in a prescription    Labwork: None ordered   Testing/Procedures: None ordered   Follow-Up: Your physician wants you to follow-up in: 6 months with Dr Court Joy will receive a reminder letter in the mail two months in advance. If you don't receive a letter, please call our office to schedule the follow-up appointment.   Any Other Special Instructions Will Be Listed Below (If Applicable).     If you need a refill on your cardiac medications before your next appointment, please call your pharmacy.

## 2015-11-22 NOTE — Progress Notes (Signed)
HPI Heather Keith returns today for followup. She is a very pleasant 65 year old woman with a history of paroxysmal atrial fibrillation. She also has vagal spells with multiple episodes of near syncope. Her palpitations have been well-controlled in the past year. She has been retired for over 2 years. She is still walking.  She developed atrial fib which worsened and I referred her to Dr. Fawn Kirk. She was placed on Eliquis but was intolerant of this medication. She opted not to have an ablation. No other complaints. Her HR's have been in the 40's and low 50's. She is walking 2 miles almost daily. Denies ETOH or caffeine abuse. Allergies  Allergen Reactions  . Penicillins Rash     Current Outpatient Prescriptions  Medication Sig Dispense Refill  . acetaminophen (TYLENOL) 500 MG tablet Take 500 mg by mouth every 6 (six) hours as needed (pain).     Marland Kitchen aspirin 81 MG tablet Take 162 mg by mouth daily.     . diclofenac (VOLTAREN) 75 MG EC tablet Take 1 tablet by mouth daily as needed for mild pain. pain    . escitalopram (LEXAPRO) 10 MG tablet Take 10 mg by mouth daily.    . flecainide (TAMBOCOR) 100 MG tablet TAKE 1 TABLET BY MOUTH TWICE DAILY 60 tablet 11  . metoprolol tartrate (LOPRESSOR) 25 MG tablet Take 0.5 tablets (12.5 mg total) by mouth 2 (two) times daily. 30 tablet 4  . Multiple Vitamins-Minerals (MULTIVITAMIN WITH MINERALS) tablet Take 1 tablet by mouth daily.    Marland Kitchen omeprazole (PRILOSEC) 20 MG capsule Take 20 mg by mouth daily.    . simvastatin (ZOCOR) 20 MG tablet Take 20 mg by mouth daily.     Marland Kitchen zolpidem (AMBIEN) 10 MG tablet Take 0.5 tablets by mouth at bedtime as needed. sleep     No current facility-administered medications for this visit.     Past Medical History  Diagnosis Date  . HTN (hypertension)     (patient denies)  . Paroxysmal a-fib (HCC)     CHADS2vasc =1  . Typical atrial flutter (HCC)   . Vasovagal syncope 04/28/13  . Neck pain   . PONV (postoperative nausea and  vomiting)     and had a siezure once at dentist     ROS:   All systems reviewed and negative except as noted in the HPI.   Past Surgical History  Procedure Laterality Date  . Knee surgery    . Tee without cardioversion N/A 04/22/2015    Procedure: TRANSESOPHAGEAL ECHOCARDIOGRAM (TEE);  Surgeon: Quintella Reichert, MD;  [facility]: [facility] ENDOSCOPY;  Service: Cardiovascular;  Laterality: N/A;  . Cardioversion N/A 04/22/2015    Procedure: CARDIOVERSION;  Surgeon: Quintella Reichert, MD;  Location: [facility] ENDOSCOPY;  Service: Cardiovascular;  Laterality: N/A;     Family History  Problem Relation Age of Onset  . Hodgkin's lymphoma Mother   . Heart attack Father 13  . Diabetes Maternal Grandfather      Social History   Social History  . Marital Status: Widowed    Spouse Name: N/A  . Number of Children: N/A  . Years of Education: N/A   Occupational History  . Not on file.   Social History Main Topics  . Smoking status: Never Smoker   . Smokeless tobacco: Not on file  . Alcohol Use: Yes     Comment: occasionally  . Drug Use: No  . Sexual Activity: Not on file   Other Topics Concern  . Not on file  Social History Narrative     BP 122/74 mmHg  Pulse 57  Ht  (1.778 m)  Wt 195 lb 9.6 oz (88.724 kg)  BMI 28.07 kg/m2  SpO2 91%  Physical Exam:  Unwell appearing middle-age woman,NAD HEENT: Unremarkable Neck:  6 cm JVD, no thyromegally Lungs:  Clear with no wheezes, rales, or rhonchi. HEART:  IRegular tachy rhythm, no murmurs, no rubs, no clicks Abd:  soft, positive bowel sounds, no organomegally, no rebound, no guarding Ext:  2 plus pulses, no edema, no cyanosis, no clubbing Skin:  No rashes no nodules Neuro:  CN II through XII intact, motor grossly intact  EKG - sinus bradycardia  Assess/Plan: 1. Atrial fibrillation - she has opted not to have an ablation. She has stopped Eliquis. She is now 65 and her CHADSVASC score is 2. I have recommended she start Xarelto. She is  not sure if she wants to. She will be given a 30 card to start and she will decide whether to take the medication or not. She will continue her flecainide and beta blocker. 2. HTN - she denies this diagnosis 3. Syncope - due to autonomic dysfunction. Stable at present. 4. Coags - she is reflecting on trying Xarelto.  Leonia Reeves.D.

## 2015-12-21 ENCOUNTER — Telehealth: Payer: Self-pay | Admitting: Internal Medicine

## 2015-12-21 MED ORDER — ATENOLOL 25 MG PO TABS: 25.0000 mg | ORAL_TABLET | Freq: Every day | ORAL | Status: DC

## 2015-12-21 NOTE — Telephone Encounter (Signed)
Discussed with Dr Ladona Ridgelaylor will put back on Atenolol 25 mg daily.  Patient aware

## 2015-12-21 NOTE — Telephone Encounter (Signed)
New message   Patient calling   Pt c/o medication issue:  1. Name of Medication: metoprolol  25 mg   2. How are you currently taking this medication (dosage and times per day)? Twice a day   3. Are you having a reaction (difficulty breathing--STAT)? Sleeping all the time.  4. What is your medication issue? Wants to go back on atenolol

## 2016-01-11 ENCOUNTER — Other Ambulatory Visit: Payer: Self-pay | Admitting: Internal Medicine

## 2016-06-19 ENCOUNTER — Ambulatory Visit (INDEPENDENT_AMBULATORY_CARE_PROVIDER_SITE_OTHER): Payer: Federal, State, Local not specified - PPO | Admitting: Internal Medicine

## 2016-06-19 ENCOUNTER — Encounter (INDEPENDENT_AMBULATORY_CARE_PROVIDER_SITE_OTHER): Payer: Self-pay

## 2016-06-19 ENCOUNTER — Encounter: Payer: Self-pay | Admitting: Internal Medicine

## 2016-06-19 VITALS — BP 124/76 | HR 57 | Ht 70.0 in | Wt 195.8 lb

## 2016-06-19 DIAGNOSIS — I10 Essential (primary) hypertension: Secondary | ICD-10-CM

## 2016-06-19 DIAGNOSIS — I480 Paroxysmal atrial fibrillation: Secondary | ICD-10-CM | POA: Diagnosis not present

## 2016-06-19 NOTE — Progress Notes (Signed)
HPI [name] returns today for followup. She is a very pleasant 65 year old woman with a history of paroxysmal atrial fibrillation. Her palpitations have been well-controlled in the past year. She has been retired for over 2 years. She is still walking.   She is walking 2 miles almost daily. She has started playing pickle ball. Denies ETOH or caffeine abuse. Still refuses an OAC Allergies  Allergen Reactions  . Penicillins Rash     Current Outpatient Prescriptions  Medication Sig Dispense Refill  . acetaminophen (TYLENOL) 500 MG tablet Take 500 mg by mouth every 6 (six) hours as needed (pain).     Marland Kitchen aspirin 81 MG tablet Take 81 mg by mouth daily.     Marland Kitchen atenolol (TENORMIN) 25 MG tablet Take 1 tablet (25 mg total) by mouth daily. 90 tablet 3  . escitalopram (LEXAPRO) 10 MG tablet Take 10 mg by mouth daily.    . flecainide (TAMBOCOR) 100 MG tablet TAKE 1 TABLET BY MOUTH TWICE DAILY 60 tablet 11  . Multiple Vitamins-Minerals (MULTIVITAMIN WITH MINERALS) tablet Take 1 tablet by mouth daily.    Marland Kitchen omeprazole (PRILOSEC) 20 MG capsule Take 20 mg by mouth daily.    . simvastatin (ZOCOR) 20 MG tablet Take 20 mg by mouth daily.     Marland Kitchen zolpidem (AMBIEN) 10 MG tablet Take 10 mg by mouth at bedtime. sleep     No current facility-administered medications for this visit.      Past Medical History:  Diagnosis Date  . HTN (hypertension)    (patient denies)  . Neck pain   . Paroxysmal a-fib (HCC)    CHADS2vasc =1  . PONV (postoperative nausea and vomiting)    and had a siezure once at dentist   . Typical atrial flutter (HCC)   . Vasovagal syncope 04/28/13    ROS:   All systems reviewed and negative except as noted in the HPI.   Past Surgical History:  Procedure Laterality Date  . CARDIOVERSION N/A 04/22/2015   Procedure: CARDIOVERSION;  Surgeon: Quintella Reichert, MD;  Location: [facility] ENDOSCOPY;  Service: Cardiovascular;  Laterality: N/A;  . KNEE SURGERY    . TEE WITHOUT CARDIOVERSION N/A  04/22/2015   Procedure: TRANSESOPHAGEAL ECHOCARDIOGRAM (TEE);  Surgeon: Quintella Reichert, MD;  Location: [facility] ENDOSCOPY;  Service: Cardiovascular;  Laterality: N/A;     Family History  Problem Relation Age of Onset  . Hodgkin's lymphoma Mother   . Heart attack Father 43  . Diabetes Maternal Grandfather      Social History   Social History  . Marital status: Widowed    Spouse name: N/A  . Number of children: N/A  . Years of education: N/A   Occupational History  . Not on file.   Social History Main Topics  . Smoking status: Never Smoker  . Smokeless tobacco: Not on file  . Alcohol use Yes     Comment: occasionally  . Drug use: No  . Sexual activity: Not on file   Other Topics Concern  . Not on file   Social History Narrative  . No narrative on file     BP 124/76   Pulse (!) 57   Ht 5\' 10"  (1.778 m)   Wt 195 lb 12.8 oz (88.8 kg)   BMI 28.09 kg/m   Physical Exam:  well appearing middle-age woman,NAD HEENT: Unremarkable Neck:  6 cm JVD, no thyromegally Lungs:  Clear with no wheezes, rales, or rhonchi. HEART:  Regular rate and rhythm, no  murmurs, no rubs, no clicks Abd:  soft, positive bowel sounds, no organomegally, no rebound, no guarding Ext:  2 plus pulses, no edema, no cyanosis, no clubbing Skin:  No rashes no nodules Neuro:  CN II through XII intact, motor grossly intact  EKG - sinus bradycardia  Assess/Plan: 1. Atrial fibrillation -  She is 65 and her CHADSVASC score is 2. I have recommended she take systemic anti-coagulation. She refuses. She will continue her flecainide and beta blocker. 2. HTN - she denies this diagnosis. 3. Syncope - due to autonomic dysfunction. Stable at present. 4. Coags - she will continue ASA. "I bruise too easy already"  Leonia ReevesGregg Taylor,M.D.

## 2016-06-19 NOTE — Patient Instructions (Addendum)

## 2016-07-09 ENCOUNTER — Other Ambulatory Visit: Payer: Self-pay | Admitting: Internal Medicine

## 2016-08-08 ENCOUNTER — Other Ambulatory Visit: Payer: Self-pay | Admitting: Internal Medicine

## 2016-08-13 ENCOUNTER — Other Ambulatory Visit: Payer: Self-pay | Admitting: Internal Medicine

## 2017-05-17 ENCOUNTER — Encounter: Payer: Self-pay | Admitting: *Deleted

## 2017-06-10 ENCOUNTER — Ambulatory Visit (INDEPENDENT_AMBULATORY_CARE_PROVIDER_SITE_OTHER): Payer: Federal, State, Local not specified - PPO | Admitting: Internal Medicine

## 2017-06-10 ENCOUNTER — Encounter: Payer: Self-pay | Admitting: Internal Medicine

## 2017-06-10 ENCOUNTER — Encounter (INDEPENDENT_AMBULATORY_CARE_PROVIDER_SITE_OTHER): Payer: Self-pay

## 2017-06-10 VITALS — BP 126/73 | HR 54 | Ht 70.0 in | Wt 195.2 lb

## 2017-06-10 DIAGNOSIS — I480 Paroxysmal atrial fibrillation: Secondary | ICD-10-CM | POA: Diagnosis not present

## 2017-06-10 NOTE — Progress Notes (Signed)
HPI [name] returns for ongoing evaluation and management of paroxysmal atrial fibrillation. The patient is a 66 year old woman with a history of paroxysmal atrial fibrillation for over 18 years. She denies chest pain or shortness of breath and has had no episodes in the last year. She has refused systemic anticoagulation in the past. She continues to exercise regularly. She denies chest pain or shortness of breath. Previously, she had very rapid atrial fibrillation. She notes that [place] She does note some dizziness at times which is associated with blood pressures in the low 100s and heart rates in the 40s. Allergies  Allergen Reactions  . Penicillins Rash     Current Outpatient Prescriptions  Medication Sig Dispense Refill  . acetaminophen (TYLENOL) 500 MG tablet Take 500 mg by mouth every 6 (six) hours as needed (pain).     Marland Kitchen. aspirin 81 MG tablet Take 81 mg by mouth daily.     Marland Kitchen. atenolol (TENORMIN) 25 MG tablet Take 1 tablet (25 mg total) by mouth daily. 90 tablet 3  . diclofenac (VOLTAREN) 75 MG EC tablet Take 75 mg by mouth as needed.    . diclofenac sodium (VOLTAREN) 1 % GEL Apply 1 application topically as needed.    Marland Kitchen. escitalopram (LEXAPRO) 10 MG tablet Take 10 mg by mouth daily.    . flecainide (TAMBOCOR) 100 MG tablet TAKE 1 TABLET BY MOUTH TWICE DAILY 60 tablet 11  . Multiple Vitamins-Minerals (MULTIVITAMIN WITH MINERALS) tablet Take 1 tablet by mouth daily.    Marland Kitchen. omeprazole (PRILOSEC) 20 MG capsule Take 20 mg by mouth daily as needed.     . simvastatin (ZOCOR) 20 MG tablet Take 20 mg by mouth daily.     Marland Kitchen. zolpidem (AMBIEN) 10 MG tablet Take 5 mg by mouth at bedtime. sleep     No current facility-administered medications for this visit.      Past Medical History:  Diagnosis Date  . HTN (hypertension)    (patient denies)  . Neck pain   . Paroxysmal A-fib (HCC)    CHADS2vasc =1  . PONV (postoperative nausea and  vomiting)    and had a siezure once at dentist   . Typical atrial flutter (HCC)   . Vasovagal syncope 04/28/13    ROS:   All systems reviewed and negative except as noted in the HPI.   Past Surgical History:  Procedure Laterality Date  . CARDIOVERSION N/A 04/22/2015   Procedure: CARDIOVERSION;  Surgeon: Quintella Reichertraci R Turner, MD;  Location: [facility] ENDOSCOPY;  Service: Cardiovascular;  Laterality: N/A;  . KNEE SURGERY    . TEE WITHOUT CARDIOVERSION N/A 04/22/2015   Procedure: TRANSESOPHAGEAL ECHOCARDIOGRAM (TEE);  Surgeon: Quintella Reichertraci R Turner, MD;  [facility];  Service: Cardiovascular;  Laterality: N/A;     Family History  Problem Relation Age of Onset  . Hodgkin's lymphoma Mother   . Heart attack Father [phone]  . Diabetes Maternal Grandfather      Social History   Social History  . Marital status: Widowed    Spouse name: N/A  . Number of children: N/A  . Years of education: N/A   Occupational History  . Not on file.   Social History Main Topics  . Smoking status: Never Smoker  . Smokeless tobacco: Never Used  . Alcohol use Yes     Comment: occasionally  . Drug use: No  . Sexual activity: Not on file   Other Topics  Concern  . Not on file   Social History Narrative  . No narrative on file     BP 126/73   Pulse (!) 54   Ht  (1.778 m)   Wt 195 lb 3.2 oz (88.5 kg)   SpO2 98%   BMI 28.01 kg/m   Physical Exam:  Well appearing 66 year old woman, NAD HEENT: Unremarkable Neck:  6 cm JVD, no thyromegally Lymphatics:  No adenopathy Back:  No CVA tenderness Lungs:  Clear, with no wheezes, rales, or rhonchi HEART:  Regular rate rhythm, no murmurs, no rubs, no clicks Abd:  soft, positive bowel sounds, no organomegally, no rebound, no guarding Ext:  2 plus pulses, no edema, no cyanosis, no clubbing Skin:  No rashes no nodules Neuro:  CN II through XII intact, motor grossly intact  EKG - normal sinus rhythm with sinus bradycardia and first-degree AV block. QRS  duration is 90 ms.  Assess/Plan: 1. Paroxysmal atrial fibrillation - she is maintaining sinus rhythm very nicely on her current medical therapy. No change at this time. 2. Occasional dizzy spells - the patient has occasional low blood pressures, and I recommended that she hold her atenolol if her blood pressure is low. In addition, she is encouraged to increase her sodium intake if her blood pressure is low. 3. Sinus node dysfunction - she has resting sinus bradycardia and at times her heart rate is even lower. I've instructed the patient to hold her atenolol if this is in fact the case. Alternatively she could reduce the dose by half. 4. coags - she is still not interested in pursuing systemic anticoagulation.  Lewayne Bunting, M.D

## 2017-06-10 NOTE — Patient Instructions (Signed)

## 2017-06-27 ENCOUNTER — Other Ambulatory Visit: Payer: Self-pay | Admitting: Internal Medicine

## 2018-06-18 ENCOUNTER — Encounter: Payer: Self-pay | Admitting: Internal Medicine

## 2018-06-18 ENCOUNTER — Encounter (INDEPENDENT_AMBULATORY_CARE_PROVIDER_SITE_OTHER): Payer: Self-pay

## 2018-06-18 ENCOUNTER — Ambulatory Visit: Payer: Federal, State, Local not specified - PPO | Admitting: Internal Medicine

## 2018-06-18 VITALS — BP 114/76 | HR 60 | Ht 70.0 in | Wt 195.0 lb

## 2018-06-18 DIAGNOSIS — G909 Disorder of the autonomic nervous system, unspecified: Secondary | ICD-10-CM | POA: Diagnosis not present

## 2018-06-18 DIAGNOSIS — I480 Paroxysmal atrial fibrillation: Secondary | ICD-10-CM

## 2018-06-18 NOTE — Patient Instructions (Signed)

## 2018-06-18 NOTE — Progress Notes (Signed)
HPI Mrs. Lehan returns today for followup of PAF. She has done well in the interim. She has rare episodes of palpitations. She has not had syncope. She denies peripheral edema.  Allergies  Allergen Reactions  . Penicillins Rash     Current Outpatient Medications  Medication Sig Dispense Refill  . acetaminophen (TYLENOL) 500 MG tablet Take 500 mg by mouth every 6 (six) hours as needed (pain).     Marland Kitchen aspirin 81 MG tablet Take 81 mg by mouth daily.     Marland Kitchen atenolol (TENORMIN) 25 MG tablet Take 1 tablet (25 mg total) by mouth daily. (Patient taking differently: Take 12.5 mg by mouth daily. ) 90 tablet 3  . Coenzyme Q-10 200 MG CAPS Take 1 capsule by mouth daily.    . diclofenac (VOLTAREN) 75 MG EC tablet Take 75 mg by mouth as needed.    . diclofenac sodium (VOLTAREN) 1 % GEL Apply 1 application topically as needed.    Marland Kitchen escitalopram (LEXAPRO) 10 MG tablet Take 10 mg by mouth daily.    . flecainide (TAMBOCOR) 100 MG tablet TAKE 1 TABLET BY MOUTH TWICE DAILY 60 tablet 11  . Multiple Vitamins-Minerals (MULTIVITAMIN WITH MINERALS) tablet Take 1 tablet by mouth daily.    Marland Kitchen PAZEO 0.7 % SOLN Place 1 drop into both eyes as needed for allergies.  6  . simvastatin (ZOCOR) 20 MG tablet Take 20 mg by mouth daily.     Marland Kitchen zolpidem (AMBIEN) 10 MG tablet Take 5 mg by mouth at bedtime. sleep     No current facility-administered medications for this visit.      Past Medical History:  Diagnosis Date  . HTN (hypertension)    (patient denies)  . Neck pain   . Paroxysmal A-fib (HCC)    CHADS2vasc =1  . PONV (postoperative nausea and vomiting)    and had a siezure once at dentist   . Typical atrial flutter (HCC)   . Vasovagal syncope 04/28/13    ROS:   All systems reviewed and negative except as noted in the HPI.   Past Surgical History:  Procedure Laterality Date  . CARDIOVERSION N/A 04/22/2015   Procedure: CARDIOVERSION;  Surgeon: Quintella Reichert, MD;  Location: [facility] ENDOSCOPY;  Service:  Cardiovascular;  Laterality: N/A;  . KNEE SURGERY    . TEE WITHOUT CARDIOVERSION N/A 04/22/2015   Procedure: TRANSESOPHAGEAL ECHOCARDIOGRAM (TEE);  Surgeon: Quintella Reichert, MD;  Location: [facility] ENDOSCOPY;  Service: Cardiovascular;  Laterality: N/A;     Family History  Problem Relation Age of Onset  . Hodgkin's lymphoma Mother   . Heart attack Father 46  . Diabetes Maternal Grandfather      Social History   Socioeconomic History  . Marital status: Widowed    Spouse name: Not on file  . Number of children: Not on file  . Years of education: Not on file  . Highest education level: Not on file  Occupational History  . Not on file  Social Needs  . Financial resource strain: Not on file  . Food insecurity:    Worry: Not on file    Inability: Not on file  . Transportation needs:    Medical: Not on file    Non-medical: Not on file  Tobacco Use  . Smoking status: Never Smoker  . Smokeless tobacco: Never Used  Substance and Sexual Activity  . Alcohol use: Yes    Comment: occasionally  . Drug use: No  . Sexual  activity: Not on file  Lifestyle  . Physical activity:    Days per week: Not on file    Minutes per session: Not on file  . Stress: Not on file  Relationships  . Social connections:    Talks on phone: Not on file    Gets together: Not on file    Attends religious service: Not on file    Active member of club or organization: Not on file    Attends meetings of clubs or organizations: Not on file    Relationship status: Not on file  . Intimate partner violence:    Fear of current or ex partner: Not on file    Emotionally abused: Not on file    Physically abused: Not on file    Forced sexual activity: Not on file  Other Topics Concern  . Not on file  Social History Narrative  . Not on file     BP 114/76   Pulse 60   Ht 5\' 10"  (1.778 m)   Wt 195 lb (88.5 kg)   SpO2 98%   BMI 27.98 kg/m   Physical Exam:  Well appearing 67 yo woman, NAD HEENT:  Unremarkable Neck:  6 cm JVD, no thyromegally Lymphatics:  No adenopathy Back:  No CVA tenderness Lungs:  Clear with no wheezes HEART:  Regular rate rhythm, no murmurs, no rubs, no clicks Abd:  soft, positive bowel sounds, no organomegally, no rebound, no guarding Ext:  2 plus pulses, no edema, no cyanosis, no clubbing Skin:  No rashes no nodules Neuro:  CN II through XII intact, motor grossly intact  EKG - nsr with first degree AV block.   Assess/Plan: 1. PAF - she is maintaining NSR very nicely on flecainide therapy. She will continue her atenolol as well. 2. HTN - her blood pressure is well controlled. No change in her meds. 3. Coags - she remains unwilling to take systemic anti-coagulation, CHADSVASC 3.   Leonia ReevesGregg Taylor,M.D.

## 2018-06-24 ENCOUNTER — Other Ambulatory Visit: Payer: Self-pay | Admitting: Internal Medicine

## 2019-04-27 ENCOUNTER — Other Ambulatory Visit: Payer: Federal, State, Local not specified - PPO

## 2019-04-27 ENCOUNTER — Other Ambulatory Visit: Payer: Self-pay

## 2019-04-27 DIAGNOSIS — Z20822 Contact with and (suspected) exposure to covid-19: Secondary | ICD-10-CM

## 2019-04-29 LAB — NOVEL CORONAVIRUS, NAA: SARS-CoV-2, NAA: NOT DETECTED

## 2019-05-04 ENCOUNTER — Telehealth: Payer: Self-pay | Admitting: Family Medicine

## 2019-05-04 NOTE — Telephone Encounter (Signed)
Pt given Covid-19 results °

## 2019-05-06 ENCOUNTER — Ambulatory Visit: Payer: Self-pay | Admitting: Family Medicine

## 2019-06-10 ENCOUNTER — Other Ambulatory Visit: Payer: Self-pay | Admitting: Internal Medicine

## 2019-07-06 ENCOUNTER — Ambulatory Visit: Payer: Self-pay | Admitting: Family Medicine

## 2019-07-16 ENCOUNTER — Other Ambulatory Visit: Payer: Self-pay

## 2019-07-17 ENCOUNTER — Other Ambulatory Visit: Payer: Self-pay

## 2019-07-17 ENCOUNTER — Ambulatory Visit: Payer: Federal, State, Local not specified - PPO | Admitting: Family Medicine

## 2019-07-17 ENCOUNTER — Encounter: Payer: Self-pay | Admitting: Family Medicine

## 2019-07-17 ENCOUNTER — Encounter (INDEPENDENT_AMBULATORY_CARE_PROVIDER_SITE_OTHER): Payer: Self-pay

## 2019-07-17 VITALS — BP 110/68 | HR 58 | Temp 98.2°F | Ht 70.0 in | Wt 191.0 lb

## 2019-07-17 DIAGNOSIS — I480 Paroxysmal atrial fibrillation: Secondary | ICD-10-CM

## 2019-07-17 DIAGNOSIS — F5101 Primary insomnia: Secondary | ICD-10-CM

## 2019-07-17 DIAGNOSIS — I10 Essential (primary) hypertension: Secondary | ICD-10-CM | POA: Diagnosis not present

## 2019-07-17 DIAGNOSIS — E782 Mixed hyperlipidemia: Secondary | ICD-10-CM | POA: Diagnosis not present

## 2019-07-17 DIAGNOSIS — Z7689 Persons encountering health services in other specified circumstances: Secondary | ICD-10-CM | POA: Diagnosis not present

## 2019-07-17 DIAGNOSIS — Z79899 Other long term (current) drug therapy: Secondary | ICD-10-CM

## 2019-07-17 DIAGNOSIS — R4589 Other symptoms and signs involving emotional state: Secondary | ICD-10-CM

## 2019-07-17 MED ORDER — ZOLPIDEM TARTRATE 5 MG PO TABS: 5.0000 mg | ORAL_TABLET | Freq: Every evening | ORAL | 5 refills | Status: DC | PRN

## 2019-07-17 NOTE — Patient Instructions (Signed)
Please have your mammogram results sent to me. Let me know if you need a referral to Dr Laural Golden for colon cancer screening  Lexapro taper: Take 1/2 tablet daily for 2 weeks.  Then 1/2 tablet every other day for 2 weeks. Then stop.  I have sent in the 5mg  of Ambien for ease.  Your labs have been ordered.  Please come in November fasting for these.  We may consider changing you back to lipitor pending that result.  I will reach out to Dr Lovena Le about the Atenolol and your blood pressure.

## 2019-07-17 NOTE — Progress Notes (Signed)
Subjective: XF:[id number] care, insomnia, mood disorder, hyperlipidemia, atrial fibrillation HPI: Heather Keith is a 68 y.o. female presenting to clinic today for:  1.  Atrial fibrillation Patient is followed by Dr. Lovena Le for atrial fibrillation.  She has had atrial fibrillation for many years and it is paroxysmal.  She notes that her A. fib is exercise-induced and she tries to avoid overexerting herself.  She is compliant with both the flecainide and the atenolol 12.5 mg.  She does note hypotensive episodes intermittently.  She has had episode so severe of systolics into the 69C in the past.  She has not tried other beta-blockers like Coreg before.  She is also treated for hyperlipidemia with pravastatin.  This was recently increased to 40 mg given persistently elevated LDL.  She was previously on both the simvastatin, which caused myalgia, and Lipitor, which at that time was brand-name and was not very affordable.  She tolerated the Lipitor without difficulty the.  No chest pain, shortness of breath.  She has intermittent dizziness that is stable for her.  2.  Insomnia/mood disorder Patient is prescribed Lexapro for moodiness.  She denies any overt depressive or anxiety symptoms.  She did used to have a lot of stress in her home with 4 dogs and a daughter who reside with her.  Since that time, 2 the dogs have moved out with her daughter, who reside in [place] now.  She reports that life is a lot more calm and she would like to try and taper off of the Lexapro if possible.  She does take Ambien, 5 mg, every night at bedtime.  She tried going without it before and her sleep has gotten much worse.  Denies any excessive daytime sedation, falls, confusion or memory changes.  She was prescribed a 10 mg but self titrated down to 5 mg.  3.  Preventative care Patient has a mammogram scheduled in November.  She sees Dr. Melony Overly for colonoscopy and is due.  She will call to schedule.  Past Medical  History:  Diagnosis Date  . HTN (hypertension)    (patient denies)  . Neck pain   . Paroxysmal A-fib ([place])    CHADS2vasc =1  . PONV (postoperative nausea and vomiting)    and had a siezure once at dentist   . Typical atrial flutter ([place])   . Vasovagal syncope 04/28/13   Past Surgical History:  Procedure Laterality Date  . CARDIOVERSION N/A 04/22/2015   Procedure: CARDIOVERSION;  Surgeon: Sueanne Margarita, MD;  [facility];  Service: Cardiovascular;  Laterality: N/A;  . KNEE SURGERY    . TEE WITHOUT CARDIOVERSION N/A 04/22/2015   Procedure: TRANSESOPHAGEAL ECHOCARDIOGRAM (TEE);  Surgeon: Sueanne Margarita, MD;  Location: [facility] ENDOSCOPY;  Service: Cardiovascular;  Laterality: N/A;   Social History   Socioeconomic History  . Marital status: Widowed    Spouse name: Not on file  . Number of children: Not on file  . Years of education: Not on file  . Highest education level: Not on file  Occupational History  . Not on file  Social Needs  . Financial resource strain: Not on file  . Food insecurity    Worry: Not on file    Inability: Not on file  . Transportation needs    Medical: Not on file    Non-medical: Not on file  Tobacco Use  . Smoking status: Never Smoker  . Smokeless tobacco: Never Used  Substance and Sexual Activity  . Alcohol use:  Yes    Comment: occasionally  . Drug use: No  . Sexual activity: Not on file  Lifestyle  . Physical activity    Days per week: Not on file    Minutes per session: Not on file  . Stress: Not on file  Relationships  . Social Herbalist on phone: Not on file    Gets together: Not on file    Attends religious service: Not on file    Active member of club or organization: Not on file    Attends meetings of clubs or organizations: Not on file    Relationship status: Not on file  . Intimate partner violence    Fear of current or ex partner: Not on file    Emotionally abused: Not on file    Physically abused: Not on file     Forced sexual activity: Not on file  Other Topics Concern  . Not on file  Social History Narrative  . Not on file   Current Meds  Medication Sig  . acetaminophen (TYLENOL) 500 MG tablet Take 500 mg by mouth every 6 (six) hours as needed (pain).   Marland Kitchen aspirin 81 MG tablet Take 81 mg by mouth daily.   Marland Kitchen atenolol (TENORMIN) 25 MG tablet Take 1 tablet (25 mg total) by mouth daily. (Patient taking differently: Take 12.5 mg by mouth daily. )  . Coenzyme Q-10 200 MG CAPS Take 1 capsule by mouth daily.  . diclofenac sodium (VOLTAREN) 1 % GEL Apply 1 application topically as needed.  Marland Kitchen escitalopram (LEXAPRO) 10 MG tablet Take 10 mg by mouth daily.  . flecainide (TAMBOCOR) 100 MG tablet TAKE 1 TABLET BY MOUTH TWICE DAILY  . Multiple Vitamins-Minerals (MULTIVITAMIN WITH MINERALS) tablet Take 1 tablet by mouth daily.  . pravastatin (PRAVACHOL) 40 MG tablet TAKE ONE TABLET BY MOUTH EVERY DAY FOR cholesterol  . zolpidem (AMBIEN) 10 MG tablet Take 5 mg by mouth at bedtime. sleep  . [DISCONTINUED] simvastatin (ZOCOR) 20 MG tablet Take 20 mg by mouth daily.    Family History  Problem Relation Age of Onset  . Hodgkin's lymphoma Mother   . Heart attack Father 53  . Diabetes Maternal Grandfather    Allergies  Allergen Reactions  . Penicillins Rash     Health Maintenance: Colon cancer screen, DEXA  ROS: Per HPI  Objective: Office vital signs reviewed. BP 110/68   Pulse (!) 58   Temp 98.2 F (36.8 C) (Other (Comment))   Ht '5\' 10"'  (1.778 m)   Wt 191 lb (86.6 kg)   SpO2 100%   BMI 27.41 kg/m   Physical Examination:  General: Awake, alert, well nourished, No acute distress HEENT: Normal, sclera white, neck supple Ms.  No goiter Cardio: regular rate and rhythm, S1S2 heard, no murmurs appreciated Pulm: clear to auscultation bilaterally, no wheezes, rhonchi or rales; normal work of breathing on room air Extremities: warm, well perfused, No edema, cyanosis or clubbing; +2 pulses bilaterally  MSK: normal gait and station Skin: dry; intact; no rashes or lesions Neuro: no tremor Psych: Mood stable, speech normal, affect appropriate, pleasant and interactive Depression screen [facility] 2/9 07/17/2019  Decreased Interest 0  Down, Depressed, Hopeless 0  PHQ - 2 Score 0  Altered sleeping 0  Tired, decreased energy 0  Change in appetite 0  Feeling bad or failure about yourself  0  Trouble concentrating 0  Moving slowly or fidgety/restless 0  Suicidal thoughts 0  PHQ-9 Score 0  No flowsheet data found.  Assessment/ Plan: 68 y.o. female   1. Paroxysmal atrial fibrillation (HCC) Both rate and rhythm controlled.  Given intermittent episodes of hypotension I wonder if she would be able to forego the atenolol totally.  I am not sure that alternative beta-blocker like Coreg or metoprolol would be less offensive to the blood pressure.  I will reach out to Dr. Lovena Le for his input.  Plan for fasting labs in November - CMP14+EGFR; Future - TSH; Future  2. Essential hypertension Under excellent control.  But again having intermittent episodes of hypotension.  May need to consider eliminating beta-blocker - CMP14+EGFR; Future  3. Mixed hyperlipidemia LDL noted to be above 100 last check with her previous PCP.  Currently on pravastatin 40.  Tolerated Lipitor without difficulty in the past.  We discussed the possibility of needing to switch back over to this if her ASCVD risk score is elevated.  We will await her November labs prior to making this change - Lipid panel; Future - CMP14+EGFR; Future - TSH; Future  4. Establishing care with new doctor, encounter for I reviewed her records in the system.  5. Primary insomnia The controlled substance contract was reviewed and signed today.  UDS obtained.  National narcotic database was reviewed.  No red flags.  Continue Ambien 5 mg nightly as needed sleep. - ToxASSURE Select 13 (MW), Urine - zolpidem (AMBIEN) 5 MG tablet; Take 1 tablet (5 mg  total) by mouth at bedtime as needed for sleep.  Dispense: 30 tablet; Refill: 5  6. Moodiness Wants to taper off Lexapro.  We discussed taper instructions and this was reiterated in her AVS.  She will follow-up as needed  7. Controlled substance agreement signed - ToxASSURE Select 13 (MW), Urine   Janora Norlander, [place] [phone]

## 2019-07-22 LAB — TOXASSURE SELECT 13 (MW), URINE

## 2019-07-31 ENCOUNTER — Ambulatory Visit: Payer: Federal, State, Local not specified - PPO | Admitting: Internal Medicine

## 2019-07-31 ENCOUNTER — Encounter: Payer: Self-pay | Admitting: Internal Medicine

## 2019-07-31 ENCOUNTER — Other Ambulatory Visit: Payer: Self-pay

## 2019-07-31 VITALS — BP 92/62 | HR 64 | Ht 70.0 in | Wt 194.0 lb

## 2019-07-31 DIAGNOSIS — I10 Essential (primary) hypertension: Secondary | ICD-10-CM

## 2019-07-31 DIAGNOSIS — I480 Paroxysmal atrial fibrillation: Secondary | ICD-10-CM | POA: Diagnosis not present

## 2019-07-31 NOTE — Progress Notes (Signed)
HPI Heather Keith returns today for followup of PAF and hypotension. She has done well in the interim. She has rare episodes of palpitations. She has not had syncope. She denies peripheral edema. lately her bp has been on the low side. Her dose of atenolol is 12.5 bid. She denies any dietary changes. Allergies  Allergen Reactions  . Penicillins Rash     Current Outpatient Medications  Medication Sig Dispense Refill  . acetaminophen (TYLENOL) 500 MG tablet Take 500 mg by mouth every 6 (six) hours as needed (pain).     Marland Kitchen aspirin 81 MG tablet Take 81 mg by mouth daily.     Marland Kitchen atenolol (TENORMIN) 25 MG tablet Take 1 tablet (25 mg total) by mouth daily. (Patient taking differently: Take 12.5 mg by mouth daily. ) 90 tablet 3  . Coenzyme Q10 (CO Q-10) 400 MG CAPS Take 1 tablet by mouth daily.    . Cyanocobalamin (B-12 PO) Take 1 tablet by mouth daily.    . diclofenac sodium (VOLTAREN) 1 % GEL Apply 1 application topically as needed.    Marland Kitchen escitalopram (LEXAPRO) 10 MG tablet Take 10 mg by mouth daily.    . flecainide (TAMBOCOR) 100 MG tablet TAKE 1 TABLET BY MOUTH TWICE DAILY 60 tablet 2  . LORATADINE PO Take 1 tablet by mouth daily.    . Misc Natural Products (TART CHERRY ADVANCED) CAPS Take 1 capsule by mouth daily.    . Multiple Vitamins-Minerals (MULTIVITAMIN WITH MINERALS) tablet Take 1 tablet by mouth daily.    . pravastatin (PRAVACHOL) 40 MG tablet TAKE ONE TABLET BY MOUTH EVERY DAY FOR cholesterol    . Probiotic Product (PROBIOTIC PO) Take 1 tablet by mouth daily.    Marland Kitchen zolpidem (AMBIEN) 5 MG tablet Take 1 tablet (5 mg total) by mouth at bedtime as needed for sleep. 30 tablet 5   No current facility-administered medications for this visit.      Past Medical History:  Diagnosis Date  . HTN (hypertension)    (patient denies)  . Neck pain   . Paroxysmal A-fib ([place])    CHADS2vasc =1  . PONV (postoperative nausea and vomiting)    and had a siezure once at dentist   . Typical  atrial flutter ([place])   . Vasovagal syncope 04/28/13    ROS:   All systems reviewed and negative except as noted in the HPI.   Past Surgical History:  Procedure Laterality Date  . CARDIOVERSION N/A 04/22/2015   Procedure: CARDIOVERSION;  Surgeon: Sueanne Margarita, MD;  Location: [facility] ENDOSCOPY;  Service: Cardiovascular;  Laterality: N/A;  . KNEE SURGERY    . TEE WITHOUT CARDIOVERSION N/A 04/22/2015   Procedure: TRANSESOPHAGEAL ECHOCARDIOGRAM (TEE);  Surgeon: Sueanne Margarita, MD;  Location[facility] ENDOSCOPY;  Service: Cardiovascular;  Laterality: N/A;     Family History  Problem Relation Age of Onset  . Hodgkin's lymphoma Mother   . Heart attack Father 83  . Diabetes Maternal Grandfather      Social History   Socioeconomic History  . Marital status: Widowed    Spouse name: Not on file  . Number of children: Not on file  . Years of education: Not on file  . Highest education level: Not on file  Occupational History  . Not on file  Social Needs  . Financial resource strain: Not on file  . Food insecurity    Worry: Not on file    Inability: Not on file  .  Transportation needs    Medical: Not on file    Non-medical: Not on file  Tobacco Use  . Smoking status: Never Smoker  . Smokeless tobacco: Never Used  Substance and Sexual Activity  . Alcohol use: Yes    Comment: occasionally  . Drug use: No  . Sexual activity: Not on file  Lifestyle  . Physical activity    Days per week: Not on file    Minutes per session: Not on file  . Stress: Not on file  Relationships  . Social Musician on phone: Not on file    Gets together: Not on file    Attends religious service: Not on file    Active member of club or organization: Not on file    Attends meetings of clubs or organizations: Not on file    Relationship status: Not on file  . Intimate partner violence    Fear of current or ex partner: Not on file    Emotionally abused: Not on file    Physically abused: Not on  file    Forced sexual activity: Not on file  Other Topics Concern  . Not on file  Social History Narrative  . Not on file     BP 92/62   Pulse 64   Ht 5\' 10"  (1.778 m)   Wt 194 lb (88 kg)   SpO2 98%   BMI 27.84 kg/m   Physical Exam:  Well appearing NAD HEENT: Unremarkable Neck:  No JVD, no thyromegally Lymphatics:  No adenopathy Back:  No CVA tenderness Lungs:  Clear HEART:  Regular rate rhythm, no murmurs, no rubs, no clicks Abd:  soft, positive bowel sounds, no organomegally, no rebound, no guarding Ext:  2 plus pulses, no edema, no cyanosis, no clubbing Skin:  No rashes no nodules Neuro:  CN II through XII intact, motor grossly intact  EKG - nsr  Assess/Plan: 1. PAF - she is maintaining NSR on flecainide and her beta blocker. 2. Hypotension - as it remains important for her to stay on the beta blocker, I have asked that she increase the salt in her diet. She is headed to get a chicken sandwich. Salty foods were discussed. 3. Syncope - she has not had any in the past several years though she has noted some dizziness when she gets up too quickly at times.   .D.

## 2019-07-31 NOTE — Patient Instructions (Signed)

## 2019-08-18 ENCOUNTER — Other Ambulatory Visit: Payer: Self-pay

## 2019-08-18 ENCOUNTER — Other Ambulatory Visit: Payer: Federal, State, Local not specified - PPO

## 2019-08-18 DIAGNOSIS — I10 Essential (primary) hypertension: Secondary | ICD-10-CM

## 2019-08-18 DIAGNOSIS — I480 Paroxysmal atrial fibrillation: Secondary | ICD-10-CM

## 2019-08-18 DIAGNOSIS — E782 Mixed hyperlipidemia: Secondary | ICD-10-CM

## 2019-08-19 ENCOUNTER — Telehealth: Payer: Self-pay | Admitting: Family Medicine

## 2019-08-19 LAB — CMP14+EGFR
ALT: 14 IU/L (ref 0–32)
AST: 21 IU/L (ref 0–40)
Albumin/Globulin Ratio: 1.8 (ref 1.2–2.2)
Albumin: 4.3 g/dL (ref 3.8–4.8)
Alkaline Phosphatase: 81 IU/L (ref 39–117)
BUN/Creatinine Ratio: 16 (ref 12–28)
BUN: 15 mg/dL (ref 8–27)
Bilirubin Total: 0.4 mg/dL (ref 0.0–1.2)
CO2: 24 mmol/L (ref 20–29)
Calcium: 9.7 mg/dL (ref 8.7–10.3)
Chloride: 104 mmol/L (ref 96–106)
Creatinine, Ser: 0.93 mg/dL (ref 0.57–1.00)
GFR calc Af Amer: 73 mL/min/{1.73_m2} (ref >59)
GFR calc non Af Amer: 63 mL/min/{1.73_m2} (ref >59)
Globulin, Total: 2.4 g/dL (ref 1.5–4.5)
Glucose: 97 mg/dL (ref 65–99)
Potassium: 4.5 mmol/L (ref 3.5–5.2)
Sodium: 142 mmol/L (ref 134–144)
Total Protein: 6.7 g/dL (ref 6.0–8.5)

## 2019-08-19 LAB — LIPID PANEL
Chol/HDL Ratio: 2.6 ratio (ref 0.0–4.4)
Cholesterol, Total: 196 mg/dL (ref 100–199)
HDL: 74 mg/dL (ref >39)
LDL Chol Calc (NIH): 108 mg/dL — ABNORMAL HIGH (ref 0–99)
Triglycerides: 80 mg/dL (ref 0–149)
VLDL Cholesterol Cal: 14 mg/dL (ref 5–40)

## 2019-08-19 LAB — TSH: TSH: 4.26 u[IU]/mL (ref 0.450–4.500)

## 2019-08-19 NOTE — Telephone Encounter (Signed)
Patient aware of recommendations.  

## 2019-09-04 ENCOUNTER — Other Ambulatory Visit: Payer: Self-pay

## 2019-09-04 DIAGNOSIS — Z20822 Contact with and (suspected) exposure to covid-19: Secondary | ICD-10-CM

## 2019-09-07 ENCOUNTER — Ambulatory Visit: Payer: Self-pay

## 2019-09-07 NOTE — Telephone Encounter (Signed)
Results not available

## 2019-09-08 ENCOUNTER — Other Ambulatory Visit: Payer: Self-pay | Admitting: Internal Medicine

## 2019-09-08 LAB — NOVEL CORONAVIRUS, NAA: SARS-CoV-2, NAA: NOT DETECTED

## 2019-10-02 HISTORY — PX: SHOULDER ARTHROSCOPY: SHX128

## 2019-10-20 ENCOUNTER — Other Ambulatory Visit: Payer: Self-pay | Admitting: Family Medicine

## 2019-11-13 ENCOUNTER — Other Ambulatory Visit: Payer: Self-pay | Admitting: Family Medicine

## 2019-11-13 DIAGNOSIS — Z1231 Encounter for screening mammogram for malignant neoplasm of breast: Secondary | ICD-10-CM

## 2019-12-02 ENCOUNTER — Telehealth: Payer: Self-pay | Admitting: *Deleted

## 2019-12-02 NOTE — Telephone Encounter (Signed)
   Primary Cardiologist: Dr. Ladona Ridgel  Chart reviewed as part of pre-operative protocol coverage. Patient was contacted 12/02/2019 in reference to pre-operative risk assessment for pending surgery as outlined below.  Heather Keith was last seen on 07/31/2019 by Dr. Ladona Ridgel.  Since that day, Heather Keith has done well without chest pain or shortness of breath. She is able to complete at least 4 METS of activity without any exertional symptoms.  Therefore, based on ACC/AHA guidelines, the patient would be at acceptable risk for the planned procedure without further cardiovascular testing.   I will route this recommendation to the requesting party via Epic fax function and remove from pre-op pool.  Please call with questions. It would be ideal for her to continue the aspirin through the surgery, however if absolutely necessary, may hold aspirin for 5-7 days prior to the procedure and restart as soon as possible after the procedure.   [place], [place] 12/02/2019, 11:19 AM

## 2019-12-02 NOTE — Telephone Encounter (Signed)
   [facility] Pre-operative Risk Assessment    Request for surgical clearance:  1. What type of surgery is being performed? RIGHT SHOULDER SCOPE, ROTATOR CUFF REPAIR   2. When is this surgery scheduled? TBD   3. What type of clearance is required (medical clearance vs. Pharmacy clearance to hold med vs. Both)? MEDICAL  4. Are there any medications that need to be held prior to surgery and how long? ASA   5. Practice name and name of physician performing surgery? [facility]; DR. Elsie Saas   6. What is your office phone number [phone]    7.   What is your office fax number [phone] ATTN: SHERRI  8.   Anesthesia type (None, local, MAC, general) ? CHOICE   Julaine Hua 12/02/2019, 10:17 AM  _________________________________________________________________   (provider comments below)

## 2019-12-03 ENCOUNTER — Ambulatory Visit
Admission: RE | Admit: 2019-12-03 | Discharge: 2019-12-03 | Disposition: A | Discharge status: Home / Self Care | Payer: Federal, State, Local not specified - PPO | Source: Ambulatory Visit | Attending: Family Medicine | Admitting: Family Medicine

## 2019-12-03 ENCOUNTER — Other Ambulatory Visit: Payer: Self-pay

## 2019-12-03 DIAGNOSIS — Z1231 Encounter for screening mammogram for malignant neoplasm of breast: Secondary | ICD-10-CM

## 2020-01-14 ENCOUNTER — Telehealth: Payer: Self-pay | Admitting: Family Medicine

## 2020-01-14 ENCOUNTER — Other Ambulatory Visit: Payer: Self-pay | Admitting: Family Medicine

## 2020-01-14 DIAGNOSIS — F5101 Primary insomnia: Secondary | ICD-10-CM

## 2020-01-15 ENCOUNTER — Encounter: Payer: Self-pay | Admitting: Family Medicine

## 2020-01-15 ENCOUNTER — Other Ambulatory Visit: Payer: Self-pay

## 2020-01-15 ENCOUNTER — Ambulatory Visit: Payer: Federal, State, Local not specified - PPO | Admitting: Family Medicine

## 2020-01-15 ENCOUNTER — Ambulatory Visit (INDEPENDENT_AMBULATORY_CARE_PROVIDER_SITE_OTHER): Payer: Federal, State, Local not specified - PPO

## 2020-01-15 VITALS — BP 93/58 | HR 62 | Temp 97.3°F | Ht 70.0 in | Wt 198.3 lb

## 2020-01-15 DIAGNOSIS — Z780 Asymptomatic menopausal state: Secondary | ICD-10-CM | POA: Diagnosis not present

## 2020-01-15 DIAGNOSIS — I10 Essential (primary) hypertension: Secondary | ICD-10-CM

## 2020-01-15 DIAGNOSIS — F5101 Primary insomnia: Secondary | ICD-10-CM

## 2020-01-15 DIAGNOSIS — E782 Mixed hyperlipidemia: Secondary | ICD-10-CM | POA: Diagnosis not present

## 2020-01-15 DIAGNOSIS — I480 Paroxysmal atrial fibrillation: Secondary | ICD-10-CM | POA: Diagnosis not present

## 2020-01-15 DIAGNOSIS — Z91038 Other insect allergy status: Secondary | ICD-10-CM

## 2020-01-15 DIAGNOSIS — R5383 Other fatigue: Secondary | ICD-10-CM

## 2020-01-15 DIAGNOSIS — Z1211 Encounter for screening for malignant neoplasm of colon: Secondary | ICD-10-CM

## 2020-01-15 MED ORDER — ZOLPIDEM TARTRATE 5 MG PO TABS: 5.0000 mg | ORAL_TABLET | Freq: Every evening | ORAL | 5 refills | Status: DC | PRN

## 2020-01-15 MED ORDER — PRAVASTATIN SODIUM 40 MG PO TABS: 40.0000 mg | ORAL_TABLET | Freq: Every day | ORAL | 3 refills | Status: DC

## 2020-01-15 MED ORDER — ATENOLOL 25 MG PO TABS: 12.5000 mg | ORAL_TABLET | Freq: Every day | ORAL | 3 refills | Status: DC

## 2020-01-15 MED ORDER — ESCITALOPRAM OXALATE 10 MG PO TABS: 10.0000 mg | ORAL_TABLET | Freq: Every day | ORAL | 3 refills | Status: DC

## 2020-01-15 MED ORDER — TRIAMCINOLONE ACETONIDE 0.1 % EX CREA: 1.0000 "application " | TOPICAL_CREAM | Freq: Two times a day (BID) | CUTANEOUS | 0 refills | Status: DC

## 2020-01-15 NOTE — Progress Notes (Signed)
Subjective: CC:f/u insomnia/ mood disorder, hyperlipidemia, atrial fibrillation HPI: Heather Keith is a 69 y.o. female presenting to clinic today for:  1.  Atrial fibrillation with hyperlipidemia Patient is followed by Dr. Lovena Le for atrial fibrillation.  She has had atrial fibrillation for many years and it is paroxysmal.   She is compliant with both the flecainide, atenolol 12.5 mg, pravastatin 75m.  History of myalgia w/ simvastatin and Lipitor was tolerated but wasn't affordable for her many years ago.   No chest pain, shortness of breath.    2.  Insomnia/mood disorder Patient is prescribed Lexapro for moodiness.  Denies any depressive symptoms today.  She does need a refill on Lexapro.  She continues to take Ambien 5 mg nightly for sleep and reports good sleep.  However, she does report feeling excessively tired during the day since Covid started.  She wonders if she should have her thyroid checked.  Her daughter, who is a PA, recommended this.  She has chronic constipation.  No significant change in weight, bowel habits.  No change in voice, difficulty swallowing.  She has intermittent intention tremor when she tries to use a fork sometimes.  3.  Insect bite Patient reports she had an allergic reaction to insect bites.  She had a couple of tick bites but does not report any fevers, myalgia, arthralgia, chills, headache or target lesions.  She does have quite a bit of redness and swelling surrounding each of the insect bites.  She has been applying Benadryl and apple cider vinegar with minimal relief.   Past Medical History:  Diagnosis Date  . HTN (hypertension)    (patient denies)  . Neck pain   . Parox[place]mal A-fib ([place]    CHADS2vasc =1  . PONV (postoperative nausea and vomiting)    and had a siezure once at dentist   . Typical atrial flutter ([place]   . Vasovagal syncope 04/28/13   Past Surgical History:  Procedure Laterality Date  . CARDIOVERSION N/A 04/22/2015   Procedure: CARDIOVERSION;  Surgeon: TSueanne Margarita MD;  [facility] ENDOSCOPY;  Service: Cardiovascular;  Laterality: N/A;  . KNEE SURGERY    . TEE WITHOUT CARDIOVERSION N/A 04/22/2015   Procedure: TRANSESOPHAGEAL ECHOCARDIOGRAM (TEE);  Surgeon: TSueanne Margarita MD;  [facility];  Service: Cardiovascular;  Laterality: N/A;   Social History   Socioeconomic History  . Marital status: Widowed    Spouse name: Not on file  . Number of children: Not on file  . Years of education: Not on file  . Highest education level: Not on file  Occupational History  . Not on file  Tobacco Use  . Smoking status: Never Smoker  . Smokeless tobacco: Never Used  Substance and Sexual Activity  . Alcohol use: Yes    Comment: occasionally  . Drug use: No  . Sexual activity: Not on file  Other Topics Concern  . Not on file  Social History Narrative  . Not on file   Social Determinants of Health   Financial Resource Strain:   . Difficulty of Paying Living Expenses:   Food Insecurity:   . Worried About RCharity fundraiserin the Last Year:   . RArboriculturistin the Last Year:   Transportation Needs:   . LFilm/video editor(Medical):   .Marland KitchenLack of Transportation (Non-Medical):   Physical Activity:   . Days of Exercise per Week:   . Minutes of Exercise per Session:   Stress:   .  Feeling of Stress :   Social Connections:   . Frequency of Communication with Friends and Family:   . Frequency of Social Gatherings with Friends and Family:   . Attends Religious Services:   . Active Member of Clubs or Organizations:   . Attends Archivist Meetings:   Marland Kitchen Marital Status:   Intimate Partner Violence:   . Fear of Current or Ex-Partner:   . Emotionally Abused:   Marland Kitchen Physically Abused:   . Sexually Abused:    No outpatient medications have been marked as taking for the 01/15/20 encounter (Appointment) with Janora Norlander, DO.   Family History  Problem Relation Age of Onset  .  Hodgkin's lymphoma Mother   . Heart attack Father 70  . Diabetes Maternal Grandfather    Allergies  Allergen Reactions  . Penicillins Rash     Health Maintenance: Colon cancer screen, DEXA  ROS: Per HPI  Objective: Office vital signs reviewed. BP (!) 93/58   Pulse 62   Temp (!) 97.3 F (36.3 C) (Temporal)   Ht '5\' 10"'  (1.778 m)   Wt 198 lb 4.8 oz (89.9 kg)   SpO2 95%   BMI 28.45 kg/m   Physical Examination:  General: Awake, alert, well nourished, No acute distress HEENT: Normal, sclera white, neck supple.  No exophthalmos.  No palpable thyroid nodules or goiter Cardio: regular rate and rhythm, S1S2 heard, no murmurs appreciated Pulm: clear to auscultation bilaterally, no wheezes, rhonchi or rales; normal work of breathing on room air Extremities: warm, well perfused, No edema, cyanosis or clubbing; +2 pulses bilaterally MSK: normal gait and station Skin: Several excoriated insect bites noted along the left upper extremity and right lower extremity.  No target lesions but they do have surrounding erythema and warmth.  No purulence. Neuro: no resting tremor Psych: Mood stable, speech normal, affect appropriate, pleasant and interactive Depression screen [facility] 2/9 01/15/2020 07/17/2019  Decreased Interest 1 0  Down, Depressed, Hopeless 0 0  PHQ - 2 Score 1 0  Altered sleeping 3 0  Tired, decreased energy 3 0  Change in appetite 0 0  Feeling bad or failure about yourself  0 0  Trouble concentrating 0 0  Moving slowly or fidgety/restless 0 0  Suicidal thoughts 0 0  PHQ-9 Score 7 0  Difficult doing work/chores Not difficult at all -   GAD 7 : Generalized Anxiety Score 01/15/2020  Nervous, Anxious, on Edge 0  Control/stop worrying 0  Worry too much - different things 0  Trouble relaxing 0  Restless 0  Easily annoyed or irritable 1  Afraid - awful might happen 0  Total GAD 7 Score 1  Anxiety Difficulty Not difficult at all   Assessment/ Plan: 69 y.o. female   1.  Primary insomnia National narcotic database was reviewed and there were no red flags.  Ambien renewed.  She is up-to-date on her controlled substance contract and UDS. - zolpidem (AMBIEN) 5 MG tablet; Take 1 tablet (5 mg total) by mouth at bedtime as needed for sleep.  Dispense: 30 tablet; Refill: 5  2. Paroxysmal atrial fibrillation (HCC) Rate and rhythm controlled.  Continue current regimen. - CMP14+EGFR - atenolol (TENORMIN) 25 MG tablet; Take 0.5 tablets (12.5 mg total) by mouth daily.  Dispense: 45 tablet; Refill: 3  3. Essential hypertension She is borderline hypotensive and given chronic fatigue, I wonder if she would not benefit from metoprolol 12.5 mg instead of the atenolol.  This had been previously discussed with  her cardiologist. - CMP14+EGFR - atenolol (TENORMIN) 25 MG tablet; Take 0.5 tablets (12.5 mg total) by mouth daily.  Dispense: 45 tablet; Refill: 3  4. Mixed hyperlipidemia Check fasting lipid panel.  May need to consider switching over to Lipitor if LDL not at goal - Lipid Panel - CMP14+EGFR - pravastatin (PRAVACHOL) 40 MG tablet; Take 1 tablet (40 mg total) by mouth daily.  Dispense: 90 tablet; Refill: 3  5. Asymptomatic postmenopausal estrogen deficiency Check today - [facility] DEXA; Future  6. Fatigue, unspecified type Possibly related to borderline hypotension but will check for metabolic etiology.  If metabolic work-up is negative, would like to make the switch from atenolol to metoprolol. - CBC with Differential - Thyroid Panel With TSH - Vitamin B12  7. Colon cancer screening - Cologuard  8. Allergic reaction to insect bite Home care checks reviewed and reasons for return discussed. - triamcinolone cream (KENALOG) 0.1 %; Apply 1 application topically 2 (two) times daily. x7-10 days for allergic reaction to bug bites  Dispense: 30 g; Refill: 0   Sparrow Sanzo Windell Moulding, DO [place] [phone]

## 2020-01-15 NOTE — Patient Instructions (Signed)

## 2020-01-16 LAB — CBC WITH DIFFERENTIAL/PLATELET
Basophils Absolute: 0 10*3/uL (ref 0.0–0.2)
Basos: 1 %
EOS (ABSOLUTE): 0.2 10*3/uL (ref 0.0–0.4)
Eos: 3 %
Hematocrit: 41.3 % (ref 34.0–46.6)
Hemoglobin: 13.1 g/dL (ref 11.1–15.9)
Immature Grans (Abs): 0 10*3/uL (ref 0.0–0.1)
Immature Granulocytes: 0 %
Lymphocytes Absolute: 1.9 10*3/uL (ref 0.7–3.1)
Lymphs: 30 %
MCH: 27.5 pg (ref 26.6–33.0)
MCHC: 31.7 g/dL (ref 31.5–35.7)
MCV: 87 fL (ref 79–97)
Monocytes Absolute: 0.5 10*3/uL (ref 0.1–0.9)
Monocytes: 8 %
Neutrophils Absolute: 3.7 10*3/uL (ref 1.4–7.0)
Neutrophils: 58 %
Platelets: 221 10*3/uL (ref 150–450)
RBC: 4.77 x10E6/uL (ref 3.77–5.28)
RDW: 13 % (ref 11.7–15.4)
WBC: 6.4 10*3/uL (ref 3.4–10.8)

## 2020-01-16 LAB — LIPID PANEL
Chol/HDL Ratio: 2.5 ratio (ref 0.0–4.4)
Cholesterol, Total: 181 mg/dL (ref 100–199)
HDL: 72 mg/dL (ref >39)
LDL Chol Calc (NIH): 96 mg/dL (ref 0–99)
Triglycerides: 68 mg/dL (ref 0–149)
VLDL Cholesterol Cal: 13 mg/dL (ref 5–40)

## 2020-01-16 LAB — CMP14+EGFR
ALT: 15 IU/L (ref 0–32)
AST: 16 IU/L (ref 0–40)
Albumin/Globulin Ratio: 1.7 (ref 1.2–2.2)
Albumin: 4.1 g/dL (ref 3.8–4.8)
Alkaline Phosphatase: 77 IU/L (ref 39–117)
BUN/Creatinine Ratio: 20 (ref 12–28)
BUN: 16 mg/dL (ref 8–27)
Bilirubin Total: 0.4 mg/dL (ref 0.0–1.2)
CO2: 26 mmol/L (ref 20–29)
Calcium: 9.6 mg/dL (ref 8.7–10.3)
Chloride: 103 mmol/L (ref 96–106)
Creatinine, Ser: 0.8 mg/dL (ref 0.57–1.00)
GFR calc Af Amer: 87 mL/min/{1.73_m2} (ref >59)
GFR calc non Af Amer: 75 mL/min/{1.73_m2} (ref >59)
Globulin, Total: 2.4 g/dL (ref 1.5–4.5)
Glucose: 94 mg/dL (ref 65–99)
Potassium: 4.5 mmol/L (ref 3.5–5.2)
Sodium: 143 mmol/L (ref 134–144)
Total Protein: 6.5 g/dL (ref 6.0–8.5)

## 2020-01-16 LAB — VITAMIN B12: Vitamin B-12: 845 pg/mL (ref 232–1245)

## 2020-01-16 LAB — THYROID PANEL WITH TSH
Free Thyroxine Index: 2.3 (ref 1.2–4.9)
T3 Uptake Ratio: 30 % (ref 24–39)
T4, Total: 7.7 ug/dL (ref 4.5–12.0)
TSH: 3.16 u[IU]/mL (ref 0.450–4.500)

## 2020-01-29 LAB — COLOGUARD: COLOGUARD: NEGATIVE

## 2020-02-02 LAB — COLOGUARD: Cologuard: NEGATIVE

## 2020-07-04 ENCOUNTER — Other Ambulatory Visit: Payer: Self-pay | Admitting: Family Medicine

## 2020-07-04 DIAGNOSIS — F5101 Primary insomnia: Secondary | ICD-10-CM

## 2020-07-18 ENCOUNTER — Other Ambulatory Visit: Payer: Self-pay

## 2020-07-18 ENCOUNTER — Encounter: Payer: Self-pay | Admitting: Family Medicine

## 2020-07-18 ENCOUNTER — Ambulatory Visit: Payer: Federal, State, Local not specified - PPO | Admitting: Family Medicine

## 2020-07-18 VITALS — BP 107/64 | HR 62 | Temp 97.9°F | Ht 70.0 in | Wt 201.0 lb

## 2020-07-18 DIAGNOSIS — I10 Essential (primary) hypertension: Secondary | ICD-10-CM

## 2020-07-18 DIAGNOSIS — R911 Solitary pulmonary nodule: Secondary | ICD-10-CM | POA: Diagnosis not present

## 2020-07-18 DIAGNOSIS — I480 Paroxysmal atrial fibrillation: Secondary | ICD-10-CM

## 2020-07-18 DIAGNOSIS — F5101 Primary insomnia: Secondary | ICD-10-CM

## 2020-07-18 MED ORDER — ZOLPIDEM TARTRATE 5 MG PO TABS: 5.0000 mg | ORAL_TABLET | Freq: Every evening | ORAL | 5 refills | Status: DC | PRN

## 2020-07-18 NOTE — Progress Notes (Signed)
Subjective: CC:f/u insomnia/atrial fibrillation HPI: Heather Keith is a 69 y.o. female presenting to clinic today for:  1.  Atrial fibrillation with hyperlipidemia Patient is followed by Dr. Ladona Ridgel for atrial fibrillation.  She has had atrial fibrillation for many years and it is paroxysmal.   She is compliant with both the flecainide, atenolol 12.5 mg, pravastatin 40mg .  No heart palpitations, tremor, change in exercise tolerance or shortness of breath.  She has follow-up with her cardiologist in December.  2.  Insomnia/mood disorder Patient is prescribed Lexapro for moodiness.  She continues to take Ambien 5 mg nightly.  Denies any excessive daytime sedation unless she takes the medication late.  Since she is retired, she does admit that she goes to bed later sometimes and therefore will take the Ambien later.  She does report decreased drive but does not relate this to depression but rather lack of schedule.  3.  Abnormal CT Patient reports that she had been rummaging through some old paperwork and found the CT result from 08/07/2015 which showed a 6 mm subpleural nodule in the left lower lobe.  Repeat CT was recommended in 6 to 12 months but unfortunately she thinks that this was never brought to the attention of Dr. 13/03/2015 and therefore it was never pursued.  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  She did not wear a mask during these chemical exposures.  Additionally, her father was a smoker and she had secondhand smoke exposure as a result.  She is never been a smoker herself.  Denies any change in breathing including dyspnea on exertion, wheezing or hemoptysis. Past Medical History:  Diagnosis Date  . HTN (hypertension)    (patient denies)  . Neck pain   . Paroxysmal A-fib (HCC)    CHADS2vasc =1  . PONV (postoperative nausea and vomiting)    and had a siezure once at dentist   . Typical atrial flutter (HCC)   .  Vasovagal syncope 04/28/13   Past Surgical History:  Procedure Laterality Date  . CARDIOVERSION N/A 04/22/2015   Procedure: CARDIOVERSION;  Surgeon: 04/24/2015, MD;  Location: [facility];  Service: Cardiovascular;  Laterality: N/A;  . KNEE SURGERY    . TEE WITHOUT CARDIOVERSION N/A 04/22/2015   Procedure: TRANSESOPHAGEAL ECHOCARDIOGRAM (TEE);  Surgeon: 04/24/2015, MD;  Location: [facility] ENDOSCOPY;  Service: Cardiovascular;  Laterality: N/A;   Social History   Socioeconomic History  . Marital status: Widowed    Spouse name: Not on file  . Number of children: Not on file  . Years of education: Not on file  . Highest education level: Not on file  Occupational History  . Not on file  Tobacco Use  . Smoking status: Never Smoker  . Smokeless tobacco: Never Used  Vaping Use  . Vaping Use: Never used  Substance and Sexual Activity  . Alcohol use: Yes    Comment: occasionally  . Drug use: No  . Sexual activity: Not on file  Other Topics Concern  . Not on file  Social History Narrative  . Not on file   Social Determinants of Health   Financial Resource Strain:   . Difficulty of Paying Living Expenses: Not on file  Food Insecurity:   . Worried About [facility] in the Last Year: Not on file  . Ran Out of Food in the Last Year: Not on file  Transportation Needs:   . Lack  of Transportation (Medical): Not on file  . Lack of Transportation (Non-Medical): Not on file  Physical Activity:   . Days of Exercise per Week: Not on file  . Minutes of Exercise per Session: Not on file  Stress:   . Feeling of Stress : Not on file  Social Connections:   . Frequency of Communication with Friends and Family: Not on file  . Frequency of Social Gatherings with Friends and Family: Not on file  . Attends Religious Services: Not on file  . Active Member of Clubs or Organizations: Not on file  . Attends Banker Meetings: Not on file  . Marital Status: Not on file    Intimate Partner Violence:   . Fear of Current or Ex-Partner: Not on file  . Emotionally Abused: Not on file  . Physically Abused: Not on file  . Sexually Abused: Not on file   Current Meds  Medication Sig  . acetaminophen (TYLENOL) 500 MG tablet Take 500 mg by mouth every 6 (six) hours as needed (pain).   Marland Kitchen aspirin 81 MG tablet Take 81 mg by mouth daily.   Marland Kitchen atenolol (TENORMIN) 25 MG tablet Take 0.5 tablets (12.5 mg total) by mouth daily.  . Coenzyme Q10 (CO Q-10) 400 MG CAPS Take 1 tablet by mouth daily.  . Cyanocobalamin (B-12 PO) Take 1 tablet by mouth daily.  Marland Kitchen escitalopram (LEXAPRO) 10 MG tablet Take 1 tablet (10 mg total) by mouth daily.  . flecainide (TAMBOCOR) 100 MG tablet TAKE 1 TABLET BY MOUTH TWICE DAILY  . LORATADINE PO Take 1 tablet by mouth daily.  . Multiple Vitamins-Minerals (MULTIVITAMIN WITH MINERALS) tablet Take 1 tablet by mouth daily.  . Omega-3 1000 MG CAPS Take by mouth.  . pravastatin (PRAVACHOL) 40 MG tablet Take 1 tablet (40 mg total) by mouth daily.  . Probiotic Product (PROBIOTIC PO) Take 1 tablet by mouth daily.  Marland Kitchen zolpidem (AMBIEN) 5 MG tablet Take 1 tablet (5 mg total) by mouth at bedtime as needed for sleep.   Family History  Problem Relation Age of Onset  . Hodgkin's lymphoma Mother   . Heart attack Father 17  . Diabetes Maternal Grandfather    Allergies  Allergen Reactions  . Penicillins Rash     Health Maintenance: UTD  ROS: Per HPI  Objective: Office vital signs reviewed. BP 107/64   Pulse 62   Temp 97.9 F (36.6 C) (Temporal)   Ht 5\' 10"  (1.778 m)   Wt 201 lb (91.2 kg)   SpO2 95%   BMI 28.84 kg/m   Physical Examination:  General: Awake, alert, well nourished, No acute distress HEENT: Normal, sclera white   Cardio: regular rate and rhythm, S1S2 heard, no murmurs appreciated Pulm: clear to auscultation bilaterally, no wheezes, rhonchi or rales; normal work of breathing on room air Extremities: warm, well perfused, No edema,  cyanosis or clubbing; +2 pulses bilaterally Psych: Mood stable, speech normal, affect appropriate  Depression screen [facility] 2/9 07/18/2020 01/15/2020 07/17/2019  Decreased Interest 0 1 0  Down, Depressed, Hopeless 0 0 0  PHQ - 2 Score 0 1 0  Altered sleeping 0 3 0  Tired, decreased energy 0 3 0  Change in appetite 0 0 0  Feeling bad or failure about yourself  0 0 0  Trouble concentrating 0 0 0  Moving slowly or fidgety/restless 0 0 0  Suicidal thoughts 0 0 0  PHQ-9 Score 0 7 0  Difficult doing work/chores - Not difficult at  all -   GAD 7 : Generalized Anxiety Score 01/15/2020  Nervous, Anxious, on Edge 0  Control/stop worrying 0  Worry too much - different things 0  Trouble relaxing 0  Restless 0  Easily annoyed or irritable 1  Afraid - awful might happen 0  Total GAD 7 Score 1  Anxiety Difficulty Not difficult at all   Assessment/ Plan: 69 y.o. female   1. Primary insomnia Stable and well controlled with Ambien.  National narcotic database was reviewed and there were no red flags.  CSC was updated.  UDS to be updated at next visit - zolpidem (AMBIEN) 5 MG tablet; Take 1 tablet (5 mg total) by mouth at bedtime as needed for sleep.  Dispense: 30 tablet; Refill: 5  2. Paroxysmal atrial fibrillation (HCC) Rate and rhythm controlled.  Follow-up with Dr. Ladona Ridgel in December as planned  3. Essential hypertension Controlled.  Continue current regimen  4. Incidental lung nodule, > 37mm and < 69mm She brought in a 2016 CT scan that showed a 6 mm nodule in the left lower lobe.  This unfortunate was never followed up on and therefore we will proceed with CT without contrast.  If stable, likely will not continue follow-up as she is now 5 years out from initial eval.  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  Known exposures include formaldehyde.  Secondhand smoke also exposed to by father.  Never an active smoker herself - CT Chest Wo Contrast; Future   Jury Caserta Hulen Skains, DO [facility] [phone]

## 2020-07-18 NOTE — Patient Instructions (Signed)
You will be called for the CT chest to follow up on the nodule  Controlled Substance Guidelines:  1. You cannot get an early refill, even it is lost.  2. You cannot get controlled medications from any other doctor, unless it is the emergency department and related to a new problem or injury.  3. You cannot use alcohol, marijuana, cocaine or any other recreational drugs while using this medication. This is very dangerous.  4. You are willing to have your urine drug tested at each visit.  5. You will not drive while using this medication, because that can put yourself and others in serious danger of an accident. 6. If any medication is stolen, then there must be a police report to verify it, or it cannot be refilled.  7. I will not prescribe these medications for longer than 3 months.  8. You must bring your pill bottle to each visit.  9. You must use the same pharmacy for all refills for the medication, unless you clear it with me beforehand.  10. You cannot share or sell this medication.

## 2020-07-19 ENCOUNTER — Telehealth: Payer: Self-pay | Admitting: Family Medicine

## 2020-08-10 ENCOUNTER — Ambulatory Visit (HOSPITAL_COMMUNITY)
Admission: RE | Admit: 2020-08-10 | Discharge: 2020-08-10 | Disposition: A | Discharge status: Home / Self Care | Payer: Federal, State, Local not specified - PPO | Source: Ambulatory Visit | Attending: Family Medicine | Admitting: Family Medicine

## 2020-08-10 ENCOUNTER — Other Ambulatory Visit: Payer: Self-pay

## 2020-08-10 DIAGNOSIS — R911 Solitary pulmonary nodule: Secondary | ICD-10-CM | POA: Insufficient documentation

## 2020-08-11 ENCOUNTER — Encounter: Payer: Self-pay | Admitting: Family Medicine

## 2020-08-11 DIAGNOSIS — R918 Other nonspecific abnormal finding of lung field: Secondary | ICD-10-CM | POA: Insufficient documentation

## 2020-08-11 DIAGNOSIS — K449 Diaphragmatic hernia without obstruction or gangrene: Secondary | ICD-10-CM | POA: Insufficient documentation

## 2020-08-11 DIAGNOSIS — I700 Atherosclerosis of aorta: Secondary | ICD-10-CM | POA: Insufficient documentation

## 2020-08-31 ENCOUNTER — Encounter: Payer: Self-pay | Admitting: Internal Medicine

## 2020-08-31 ENCOUNTER — Other Ambulatory Visit: Payer: Self-pay

## 2020-08-31 ENCOUNTER — Ambulatory Visit: Payer: Federal, State, Local not specified - PPO | Admitting: Internal Medicine

## 2020-08-31 VITALS — BP 116/72 | HR 62 | Ht 70.0 in | Wt 205.8 lb

## 2020-08-31 DIAGNOSIS — I480 Paroxysmal atrial fibrillation: Secondary | ICD-10-CM

## 2020-08-31 DIAGNOSIS — I10 Essential (primary) hypertension: Secondary | ICD-10-CM

## 2020-08-31 NOTE — Progress Notes (Signed)
HPI Ms. Deshaies returns today for followup. She is a pleasant 69 yo woman with a h/o HTN, PAF, and obesity. She has had minimal arrhythmia since her last visit. She has been more sedentary after shoulder surgery and gained weight. She has not had peripheral edema. No sob or chest pain.  Allergies  Allergen Reactions  . Penicillins Rash     Current Outpatient Medications  Medication Sig Dispense Refill  . acetaminophen (TYLENOL) 500 MG tablet Take 500 mg by mouth every 6 (six) hours as needed (pain).     Marland Kitchen aspirin 81 MG tablet Take 81 mg by mouth daily.     Marland Kitchen atenolol (TENORMIN) 25 MG tablet Take 0.5 tablets (12.5 mg total) by mouth daily. 45 tablet 3  . Coenzyme Q10 (CO Q-10) 400 MG CAPS Take 1 tablet by mouth daily.    Marland Kitchen escitalopram (LEXAPRO) 10 MG tablet Take 1 tablet (10 mg total) by mouth daily. 90 tablet 3  . flecainide (TAMBOCOR) 100 MG tablet TAKE 1 TABLET BY MOUTH TWICE DAILY 60 tablet 11  . LORATADINE PO Take 1 tablet by mouth daily.    . Multiple Vitamins-Minerals (MULTIVITAMIN WITH MINERALS) tablet Take 1 tablet by mouth daily.    . Omega-3 1000 MG CAPS Take by mouth.    . pravastatin (PRAVACHOL) 40 MG tablet Take 1 tablet (40 mg total) by mouth daily. 90 tablet 3  . Probiotic Product (PROBIOTIC PO) Take 1 tablet by mouth daily.    Marland Kitchen zolpidem (AMBIEN) 5 MG tablet Take 1 tablet (5 mg total) by mouth at bedtime as needed for sleep. 30 tablet 5   No current facility-administered medications for this visit.     Past Medical History:  Diagnosis Date  . HTN (hypertension)    (patient denies)  . Neck pain   . Paroxysmal A-fib (HCC)    CHADS2vasc =1  . PONV (postoperative nausea and vomiting)    and had a siezure once at dentist   . Typical atrial flutter (HCC)   . Vasovagal syncope 04/28/13    ROS:   All systems reviewed and negative except as noted in the HPI.   Past Surgical History:  Procedure Laterality Date  . CARDIOVERSION N/A 04/22/2015    Procedure: CARDIOVERSION;  Surgeon: Quintella Reichert, MD;  Location: [facility] ENDOSCOPY;  Service: Cardiovascular;  Laterality: N/A;  . KNEE SURGERY    . TEE WITHOUT CARDIOVERSION N/A 04/22/2015   Procedure: TRANSESOPHAGEAL ECHOCARDIOGRAM (TEE);  Surgeon: Quintella Reichert, MD;  Location: [facility] ENDOSCOPY;  Service: Cardiovascular;  Laterality: N/A;     Family History  Problem Relation Age of Onset  . Hodgkin's lymphoma Mother   . Heart attack Father 64  . Diabetes Maternal Grandfather      Social History   Socioeconomic History  . Marital status: Widowed    Spouse name: Not on file  . Number of children: Not on file  . Years of education: Not on file  . Highest education level: Not on file  Occupational History  . Not on file  Tobacco Use  . Smoking status: Never Smoker  . Smokeless tobacco: Never Used  Vaping Use  . Vaping Use: Never used  Substance and Sexual Activity  . Alcohol use: Yes    Comment: occasionally  . Drug use: No  . Sexual activity: Not on file  Other Topics Concern  . Not on file  Social History Narrative  . Not on file   Social Determinants  of Health   Financial Resource Strain:   . Difficulty of Paying Living Expenses: Not on file  Food Insecurity:   . Worried About Programme researcher, broadcasting/film/video in the Last Year: Not on file  . Ran Out of Food in the Last Year: Not on file  Transportation Needs:   . Lack of Transportation (Medical): Not on file  . Lack of Transportation (Non-Medical): Not on file  Physical Activity:   . Days of Exercise per Week: Not on file  . Minutes of Exercise per Session: Not on file  Stress:   . Feeling of Stress : Not on file  Social Connections:   . Frequency of Communication with Friends and Family: Not on file  . Frequency of Social Gatherings with Friends and Family: Not on file  . Attends Religious Services: Not on file  . Active Member of Clubs or Organizations: Not on file  . Attends Banker Meetings: Not on file  .  Marital Status: Not on file  Intimate Partner Violence:   . Fear of Current or Ex-Partner: Not on file  . Emotionally Abused: Not on file  . Physically Abused: Not on file  . Sexually Abused: Not on file     BP 116/72   Pulse 62   Ht 5\' 10"  (1.778 m)   Wt 205 lb 12.8 oz (93.4 kg)   SpO2 96%   BMI 29.53 kg/m   Physical Exam:  Well appearing NAD HEENT: Unremarkable Neck:  No JVD, no thyromegally Lymphatics:  No adenopathy Back:  No CVA tenderness Lungs:  Clear with no wheezes HEART:  Regular rate rhythm, no murmurs, no rubs, no clicks Abd:  soft, positive bowel sounds, no organomegally, no rebound, no guarding Ext:  2 plus pulses, no edema, no cyanosis, no clubbing Skin:  No rashes no nodules Neuro:  CN II through XII intact, motor grossly intact  EKG - nsr   Assess/Plan: 1. PAF - she is maintaining NSR very nicely. She will continue flecainide and her beta blocker. 2. Obesity - she has gained weight and I have asked her to lose 10 lbs. 3. Hypotension - her bp is well controlled. No change in meds.  Bernedette Auston,MD

## 2020-08-31 NOTE — Patient Instructions (Signed)

## 2020-09-01 ENCOUNTER — Other Ambulatory Visit: Payer: Self-pay | Admitting: Internal Medicine

## 2020-10-20 ENCOUNTER — Ambulatory Visit (INDEPENDENT_AMBULATORY_CARE_PROVIDER_SITE_OTHER): Payer: Federal, State, Local not specified - PPO | Admitting: Family

## 2020-10-20 ENCOUNTER — Encounter: Payer: Self-pay | Admitting: Family

## 2020-10-20 DIAGNOSIS — U071 COVID-19: Secondary | ICD-10-CM

## 2020-10-20 MED ORDER — DEXAMETHASONE 6 MG PO TABS: 6.0000 mg | ORAL_TABLET | Freq: Two times a day (BID) | ORAL | 0 refills | Status: DC

## 2020-10-20 MED ORDER — FLUTICASONE PROPIONATE 50 MCG/ACT NA SUSP: 2.0000 | Freq: Every day | NASAL | 6 refills | Status: DC

## 2020-10-20 NOTE — Progress Notes (Signed)
   Virtual Visit via telephone Note Due to COVID-19 pandemic this visit was conducted virtually. This visit type was conducted due to national recommendations for restrictions regarding the COVID-19 Pandemic (e.g. social distancing, sheltering in place) in an effort to limit this patient's exposure and mitigate transmission in our community. All issues noted in this document were discussed and addressed.  A physical exam was not performed with this format.  I connected with Heather Keith on 10/20/20 at 2:59 pm  by telephone and verified that I am speaking with the correct person using two identifiers. Heather Keith is currently located at home and no one is currently with her during visit. The provider, Jannifer Rodney, FNP is located in their office at time of visit.  I discussed the limitations, risks, security and privacy concerns of performing an evaluation and management service by telephone and the availability of in person appointments. I also discussed with the patient that there may be a patient responsible charge related to this service. The patient expressed understanding and agreed to proceed.   History and Present Illness:  HPI  Pt calls the office today with symptoms COVID. She reports she was diagnosed on 10/17/20. Her symptoms started on 10/15/20. She had a MAB infusion at [facility] yesterday. She is unvaccinated.   She reports nonproductive cough, sore throat, headache, chills, and body aches. Denies fever or new SOB.   Review of Systems  All other systems reviewed and are negative.    Observations/Objective: No SOB or distress noted, congestion noted  Assessment and Plan: Heather Keith comes in today with chief complaint of No chief complaint on file.   Diagnosis and orders addressed:  1. COVID-19 virus detected - Take meds as prescribed - Use a cool mist humidifier  -Use saline nose sprays frequently -Force fluids -For any cough or  congestion  Use plain Mucinex- regular strength or max strength is fine -For fever or aces or pains- take tylenol or ibuprofen. -Throat lozenges if help -Call if symptoms worsen or do not improve  - MyChart COVID-19 home monitoring program; Future - dexamethasone (DECADRON) 6 MG tablet; Take 1 tablet (6 mg total) by mouth 2 (two) times daily.  Dispense: 14 tablet; Refill: 0 - fluticasone (FLONASE) 50 MCG/ACT nasal spray; Place 2 sprays into both nostrils daily.  Dispense: 16 g; Refill: 6       I discussed the assessment and treatment plan with the patient. The patient was provided an opportunity to ask questions and all were answered. The patient agreed with the plan and demonstrated an understanding of the instructions.   The patient was advised to call back or seek an in-person evaluation if the symptoms worsen or if the condition fails to improve as anticipated.  The above assessment and management plan was discussed with the patient. The patient verbalized understanding of and has agreed to the management plan. Patient is aware to call the clinic if symptoms persist or worsen. Patient is aware when to return to the clinic for a follow-up visit. Patient educated on when it is appropriate to go to the emergency department.   Time call ended:  3:12 pm   I provided 13 minutes of non-face-to-face time during this encounter.    Jannifer Rodney, FNP

## 2020-10-22 ENCOUNTER — Encounter (INDEPENDENT_AMBULATORY_CARE_PROVIDER_SITE_OTHER): Payer: Self-pay

## 2020-10-22 ENCOUNTER — Telehealth: Payer: Self-pay

## 2020-10-22 NOTE — Telephone Encounter (Signed)
Cough: new onset cough no change from the beginning just restarted. Taking steroid and cough drops. No SOB. . If cough remains the same or better: continue to treat with over the counter medications.  Hard candy or cough drops and drinking warm fluids. Adults can also use honey 2 tsp (10 ML) at bedtime.  . If cough is becoming worse even with the use of over the counter medications and patient is not able to sleep at night, cough becomes productive with sputum that maybe yellow or green in color, contact PCP.   Weakness: symptoms reoccurred- mild dizziness, hears fluids, No weakness, numbness to face arms or legs. No visual disturbances. . If patient has worsening weakness with inability to stand or if patient has to hold on to something to get balance, advise patient to call 911 and seek treatment in ED   Pt verbalized understanding.

## 2020-10-27 ENCOUNTER — Telehealth: Payer: Self-pay | Admitting: *Deleted

## 2020-10-27 NOTE — Telephone Encounter (Signed)
Pt Called- stated that she doesn't feel right. Body is numb, she is very dizzy, she feel like she may be having a reaction from the covid infusion last week.  She was advised to go to the ER to be checked out.

## 2020-10-31 ENCOUNTER — Telehealth: Payer: Self-pay

## 2020-10-31 NOTE — Telephone Encounter (Signed)
Transition Care Management Follow-up Telephone Call  Date of discharge and from where: 10/28/2020 from [facility] ED  How have you been since you were released from the hospital? Pt states that she is feeling much better.   Any questions or concerns? Yes  Items Reviewed:  Did the pt receive and understand the discharge instructions provided? Yes   Medications obtained and verified? Yes   Other? No   Any new allergies since your discharge? No   Dietary orders reviewed? N/A  Do you have support at home? Yes   Functional Questionnaire: (I = Independent and D = Dependent) ADLs: I  Bathing/Dressing- I  Meal Prep- I  Eating- I  Maintaining continence- I  Transferring/Ambulation- I  Managing Meds- I  Follow up appointments reviewed:   Are transportation arrangements needed? No   If their condition worsens, is the pt aware to call PCP or go to the Emergency Dept.? Yes  Was the patient provided with contact information for the PCP's office or ED? Yes  Was to pt encouraged to call back with questions or concerns? Yes

## 2020-10-31 NOTE — Telephone Encounter (Signed)
Transition Care Management Unsuccessful Follow-up Telephone Call  Date of discharge and from where:  10/28/20 from [facility] ED  Attempts:  1st Attempt  Reason for unsuccessful TCM follow-up call:  Left voice message

## 2020-12-28 ENCOUNTER — Other Ambulatory Visit: Payer: Self-pay | Admitting: Family Medicine

## 2020-12-28 DIAGNOSIS — Z1231 Encounter for screening mammogram for malignant neoplasm of breast: Secondary | ICD-10-CM

## 2020-12-29 ENCOUNTER — Other Ambulatory Visit: Payer: Self-pay | Admitting: Family Medicine

## 2020-12-29 DIAGNOSIS — E782 Mixed hyperlipidemia: Secondary | ICD-10-CM

## 2020-12-29 DIAGNOSIS — I480 Paroxysmal atrial fibrillation: Secondary | ICD-10-CM

## 2020-12-29 DIAGNOSIS — I10 Essential (primary) hypertension: Secondary | ICD-10-CM

## 2021-01-16 ENCOUNTER — Encounter: Payer: Self-pay | Admitting: Family Medicine

## 2021-01-16 ENCOUNTER — Ambulatory Visit: Payer: Federal, State, Local not specified - PPO | Admitting: Family Medicine

## 2021-01-16 ENCOUNTER — Other Ambulatory Visit: Payer: Self-pay

## 2021-01-16 VITALS — BP 110/69 | HR 57 | Temp 97.9°F | Ht 70.0 in | Wt 201.2 lb

## 2021-01-16 DIAGNOSIS — I480 Paroxysmal atrial fibrillation: Secondary | ICD-10-CM | POA: Diagnosis not present

## 2021-01-16 DIAGNOSIS — I10 Essential (primary) hypertension: Secondary | ICD-10-CM | POA: Diagnosis not present

## 2021-01-16 DIAGNOSIS — F5101 Primary insomnia: Secondary | ICD-10-CM

## 2021-01-16 DIAGNOSIS — R4589 Other symptoms and signs involving emotional state: Secondary | ICD-10-CM

## 2021-01-16 DIAGNOSIS — N3281 Overactive bladder: Secondary | ICD-10-CM | POA: Diagnosis not present

## 2021-01-16 DIAGNOSIS — Z0001 Encounter for general adult medical examination with abnormal findings: Secondary | ICD-10-CM | POA: Diagnosis not present

## 2021-01-16 DIAGNOSIS — Z0000 Encounter for general adult medical examination without abnormal findings: Secondary | ICD-10-CM

## 2021-01-16 DIAGNOSIS — E782 Mixed hyperlipidemia: Secondary | ICD-10-CM

## 2021-01-16 DIAGNOSIS — M85832 Other specified disorders of bone density and structure, left forearm: Secondary | ICD-10-CM

## 2021-01-16 MED ORDER — MIRABEGRON ER 25 MG PO TB24: 25.0000 mg | ORAL_TABLET | Freq: Every day | ORAL | 0 refills | Status: DC

## 2021-01-16 MED ORDER — ESCITALOPRAM OXALATE 10 MG PO TABS: 1.0000 | ORAL_TABLET | Freq: Every day | ORAL | 3 refills | Status: DC

## 2021-01-16 MED ORDER — ATENOLOL 25 MG PO TABS: 12.5000 mg | ORAL_TABLET | Freq: Every day | ORAL | 3 refills | Status: DC

## 2021-01-16 MED ORDER — PRAVASTATIN SODIUM 40 MG PO TABS: 40.0000 mg | ORAL_TABLET | Freq: Every day | ORAL | 3 refills | Status: DC

## 2021-01-16 MED ORDER — ZOLPIDEM TARTRATE 5 MG PO TABS
5.0000 mg | ORAL_TABLET | Freq: Every evening | ORAL | 5 refills | Status: DC | PRN
Start: 2021-01-16 — End: 2021-08-07

## 2021-01-16 NOTE — Progress Notes (Signed)
Heather Keith is a 70 y.o. female presents to office today for annual physical exam examination.    Concerns today include: 1.  Insomnia Patient reports nightly use of Ambien 5 mg.  Denies any excessive daytime sedation, falls, respiratory depression, visual or auditory hallucinations.  No sleepwalking or eating  2.  Incontinence Patient reports occasional urge incontinence.  She has overactive bladder and frequently wakes 3-4 times per night to urinate.  Has not trialed anything in the past but would be willing to try something.  Occupation: Retired.  Recently enjoying the company of her 79-monthold granddaughter, Heather Keith Substance use: None Diet: Balanced, Exercise: Stays active Last eye exam: Up-to-date Last dental exam: Up-to-date Last colonoscopy: Up-to-date Last mammogram: Up to date Last pap smear: N/A Refills needed today: all Immunizations needed:  Immunization History  Administered Date(s) Administered  . Influenza Whole 07/02/2015  . Influenza, High Dose Seasonal PF 07/09/2018, 07/15/2019  . Influenza-Unspecified 07/13/2020  . Pneumococcal Conjugate-13 07/05/2016  . Pneumococcal Polysaccharide-23 10/17/2015, 07/05/2017  . Tdap 10/17/2015, 04/18/2020  . Zoster Recombinat (Shingrix) 07/09/2018, 10/07/2018     Past Medical History:  Diagnosis Date  . HTN (hypertension)    (patient denies)  . Neck pain   . Paroxysmal A-fib ([place]    CHADS2vasc =1  . PONV (postoperative nausea and vomiting)    and had a siezure once at dentist   . Typical atrial flutter ([place]   . Vasovagal syncope 04/28/13   Social History   Socioeconomic History  . Marital status: Widowed    Spouse name: Not on file  . Number of children: Not on file  . Years of education: Not on file  . Highest education level: Not on file  Occupational History  . Not on file  Tobacco Use  . Smoking status: Never Smoker  . Smokeless tobacco: Never Used  Vaping Use  . Vaping Use: Never used   Substance and Sexual Activity  . Alcohol use: Yes    Comment: occasionally  . Drug use: No  . Sexual activity: Not on file  Other Topics Concern  . Not on file  Social History Narrative  . Not on file   Social Determinants of Health   Financial Resource Strain: Not on file  Food Insecurity: Not on file  Transportation Needs: Not on file  Physical Activity: Not on file  Stress: Not on file  Social Connections: Not on file  Intimate Partner Violence: Not on file   Past Surgical History:  Procedure Laterality Date  . CARDIOVERSION N/A 04/22/2015   Procedure: CARDIOVERSION;  Surgeon: TSueanne Margarita MD;  Location: [facility];  Service: Cardiovascular;  Laterality: N/A;  . KNEE SURGERY    . TEE WITHOUT CARDIOVERSION N/A 04/22/2015   Procedure: TRANSESOPHAGEAL ECHOCARDIOGRAM (TEE);  Surgeon: TSueanne Margarita MD;  [facility];  Service: Cardiovascular;  Laterality: N/A;   Family History  Problem Relation Age of Onset  . Hodgkin's lymphoma Mother   . Heart attack Father 546 . Diabetes Maternal Grandfather     Current Outpatient Medications:  .  acetaminophen (TYLENOL) 500 MG tablet, Take 500 mg by mouth every 6 (six) hours as needed (pain). , Disp: , Rfl:  .  aspirin 81 MG tablet, Take 81 mg by mouth daily. , Disp: , Rfl:  .  atenolol (TENORMIN) 25 MG tablet, TAKE 1/2 TABLET BY MOUTH DAILY, Disp: 45 tablet, Rfl: 0 .  Coenzyme Q10 (CO Q-10) 400 MG CAPS, Take 1 tablet by mouth  daily., Disp: , Rfl:  .  escitalopram (LEXAPRO) 10 MG tablet, TAKE 1 TABLET BY MOUTH DAILY, Disp: 90 tablet, Rfl: 0 .  flecainide (TAMBOCOR) 100 MG tablet, TAKE 1 TABLET BY MOUTH TWICE DAILY, Disp: 60 tablet, Rfl: 11 .  fluticasone (FLONASE) 50 MCG/ACT nasal spray, Place 2 sprays into both nostrils daily., Disp: 16 g, Rfl: 6 .  LORATADINE PO, Take 1 tablet by mouth daily., Disp: , Rfl:  .  Multiple Vitamins-Minerals (MULTIVITAMIN WITH MINERALS) tablet, Take 1 tablet by mouth daily., Disp: , Rfl:   .  Omega-3 1000 MG CAPS, Take by mouth., Disp: , Rfl:  .  pravastatin (PRAVACHOL) 40 MG tablet, TAKE 1 TABLET BY MOUTH DAILY, Disp: 90 tablet, Rfl: 0 .  Probiotic Product (PROBIOTIC PO), Take 1 tablet by mouth daily., Disp: , Rfl:  .  zolpidem (AMBIEN) 5 MG tablet, Take 1 tablet (5 mg total) by mouth at bedtime as needed for sleep., Disp: 30 tablet, Rfl: 5  Allergies  Allergen Reactions  . Penicillins Rash     ROS: Review of Systems Pertinent items noted in HPI and remainder of comprehensive ROS otherwise negative.   Physical exam BP 110/69   Pulse (!) 57   Temp 97.9 F (36.6 C)   Ht _0  (1.778 m)   Wt 201 lb 3.2 oz (91.3 kg)   SpO2 99%   BMI 28.87 kg/m  General appearance: alert, cooperative, appears stated age and no distress Head: Normocephalic, without obvious abnormality, atraumatic Eyes: negative findings: lids and lashes normal, conjunctivae and sclerae normal, corneas clear and pupils equal, round, reactive to light and accomodation Ears: normal TM's and external ear canals both ears Nose: Nares normal. Septum midline. Mucosa normal. No drainage or sinus tenderness. Throat: lips, mucosa, and tongue normal; teeth and gums normal Neck: no adenopathy, no carotid bruit, supple, symmetrical, trachea midline and thyroid not enlarged, symmetric, no tenderness/mass/nodules Back: symmetric, no curvature. ROM normal. No CVA tenderness. Lungs: clear to auscultation bilaterally Heart: regular rate and rhythm, S1, S2 normal, no murmur, click, rub or gallop Abdomen: soft, non-tender; bowel sounds normal; no masses,  no organomegaly Pelvic: cervix normal in appearance, external genitalia normal, no adnexal masses or tenderness and not examined Extremities: extremities normal, atraumatic, no cyanosis or edema Pulses: 2+ and symmetric Skin: Skin color, texture, turgor normal. No rashes or lesions Lymph nodes: Cervical, supraclavicular, and axillary nodes normal. Neurologic: Alert  and oriented X 3, normal strength and tone. Normal symmetric reflexes. Normal coordination and gait Psych: Mood stable, speech all, affect appropriate Depression screen [facility] 2/9 01/16/2021 07/18/2020 01/15/2020  Decreased Interest 0 0 1  Down, Depressed, Hopeless 0 0 0  PHQ - 2 Score 0 0 1  Altered sleeping - 0 3  Tired, decreased energy - 0 3  Change in appetite - 0 0  Feeling bad or failure about yourself  - 0 0  Trouble concentrating - 0 0  Moving slowly or fidgety/restless - 0 0  Suicidal thoughts - 0 0  PHQ-9 Score - 0 7  Difficult doing work/chores - - Not difficult at all    Assessment/ Plan: Eli Hose here for annual physical exam.   Annual physical exam  Primary insomnia - Plan: zolpidem (AMBIEN) 5 MG tablet  Paroxysmal atrial fibrillation (HCC) - Plan: CBC, TSH, atenolol (TENORMIN) 25 MG tablet  Essential hypertension - Plan: CMP14+EGFR, atenolol (TENORMIN) 25 MG tablet  Mixed hyperlipidemia - Plan: CMP14+EGFR, Lipid Panel, TSH, pravastatin (PRAVACHOL) 40 MG tablet  Osteopenia of left forearm - Plan: VITAMIN D 25 Hydroxy (Vit-D Deficiency, Fractures)  Moodiness - Plan: escitalopram (LEXAPRO) 10 MG tablet  Overactive bladder - Plan: mirabegron ER (MYRBETRIQ) 25 MG TB24 tablet  Ambien renewed.  Symptoms are stable.  National narcotic database reviewed and there were no red flags.  She is up-to-date on CSA and UDS  A. fib seemed both rate and rhythm controlled today.  Continue atenolol.  Check CBC and TSH  Blood pressure well controlled.  Continue current regimen  Check fasting lipid.  Pravachol renewed  Check vitamin D level given known osteopenia on 2020 DEXA  Lexapro renewed.  Mood is stable  Trial of Myrbetriq 25 mg daily for overactive bladder/urge incontinence.  3 weeks sample provided.  She will let me know if this is helpful  Counseled on healthy lifestyle choices, including diet (rich in fruits, vegetables and lean meats and low in salt and  simple carbohydrates) and exercise (at least 30 minutes of moderate physical activity daily).  Patient to follow up in 1 year for annual exam or sooner if needed.  Chapel Silverthorn M. Lajuana Ripple, DO

## 2021-01-17 LAB — CMP14+EGFR
ALT: 11 IU/L (ref 0–32)
AST: 16 IU/L (ref 0–40)
Albumin/Globulin Ratio: 1.9 (ref 1.2–2.2)
Albumin: 4.1 g/dL (ref 3.8–4.8)
Alkaline Phosphatase: 80 IU/L (ref 44–121)
BUN/Creatinine Ratio: 21 (ref 12–28)
BUN: 16 mg/dL (ref 8–27)
Bilirubin Total: 0.3 mg/dL (ref 0.0–1.2)
CO2: 24 mmol/L (ref 20–29)
Calcium: 9.8 mg/dL (ref 8.7–10.3)
Chloride: 100 mmol/L (ref 96–106)
Creatinine, Ser: 0.77 mg/dL (ref 0.57–1.00)
Globulin, Total: 2.2 g/dL (ref 1.5–4.5)
Glucose: 100 mg/dL — ABNORMAL HIGH (ref 65–99)
Potassium: 4.5 mmol/L (ref 3.5–5.2)
Sodium: 139 mmol/L (ref 134–144)
Total Protein: 6.3 g/dL (ref 6.0–8.5)
eGFR: 83 mL/min/{1.73_m2} (ref >59)

## 2021-01-17 LAB — CBC
Hematocrit: 39.5 % (ref 34.0–46.6)
Hemoglobin: 12.7 g/dL (ref 11.1–15.9)
MCH: 28.3 pg (ref 26.6–33.0)
MCHC: 32.2 g/dL (ref 31.5–35.7)
MCV: 88 fL (ref 79–97)
Platelets: 231 10*3/uL (ref 150–450)
RBC: 4.48 x10E6/uL (ref 3.77–5.28)
RDW: 12.9 % (ref 11.7–15.4)
WBC: 7.7 10*3/uL (ref 3.4–10.8)

## 2021-01-17 LAB — LIPID PANEL
Chol/HDL Ratio: 2.4 ratio (ref 0.0–4.4)
Cholesterol, Total: 187 mg/dL (ref 100–199)
HDL: 77 mg/dL (ref >39)
LDL Chol Calc (NIH): 100 mg/dL — ABNORMAL HIGH (ref 0–99)
Triglycerides: 51 mg/dL (ref 0–149)
VLDL Cholesterol Cal: 10 mg/dL (ref 5–40)

## 2021-01-17 LAB — TSH: TSH: 3.55 u[IU]/mL (ref 0.450–4.500)

## 2021-01-17 LAB — VITAMIN D 25 HYDROXY (VIT D DEFICIENCY, FRACTURES): Vit D, 25-Hydroxy: 52.3 ng/mL (ref 30.0–100.0)

## 2021-02-16 ENCOUNTER — Ambulatory Visit
Admission: RE | Admit: 2021-02-16 | Discharge: 2021-02-16 | Disposition: A | Discharge status: Home / Self Care | Payer: Federal, State, Local not specified - PPO | Source: Ambulatory Visit | Attending: Family Medicine | Admitting: Family Medicine

## 2021-02-16 ENCOUNTER — Other Ambulatory Visit: Payer: Self-pay

## 2021-02-16 DIAGNOSIS — Z1231 Encounter for screening mammogram for malignant neoplasm of breast: Secondary | ICD-10-CM

## 2021-03-14 ENCOUNTER — Other Ambulatory Visit: Payer: Self-pay | Admitting: Family Medicine

## 2021-03-14 DIAGNOSIS — Z91038 Other insect allergy status: Secondary | ICD-10-CM

## 2021-04-04 ENCOUNTER — Telehealth: Payer: Self-pay | Admitting: Family Medicine

## 2021-04-04 NOTE — Telephone Encounter (Signed)
Spoke with patient, she has had ongoing fatigue, weakness and dizziness the last 3 weeks.  Said she had Covid in January and really has not felt like herself since then.  Would like to see Dr. Nadine Counts to have some labwork done.  Explained to her that Dr. Jeannett Senior' first available is 04/19/21 and offered her appointment with someone.  Patient declined, appointment scheduled for 04/19/21 at 4:15 pm.

## 2021-04-04 NOTE — Telephone Encounter (Signed)
  Incoming Patient Call  04/04/2021  What symptoms do you have? Weak,dizzy, dull headache and don't feel like doing anything and don't feel herself. Had COVID in January and don't want to see anyone but Nadine Counts and told her she was on vacation and offered her appt with another provider  How long have you been sick? 3 weeks  Have you been seen for this problem? NO  If your provider decides to give you a prescription, which pharmacy would you like for it to be sent to? [facility]   Patient informed that this information will be sent to the clinical staff for review and that they should receive a follow up call.

## 2021-04-12 ENCOUNTER — Ambulatory Visit: Payer: Federal, State, Local not specified - PPO | Admitting: Family Medicine

## 2021-04-12 ENCOUNTER — Other Ambulatory Visit: Payer: Self-pay

## 2021-04-12 ENCOUNTER — Encounter: Payer: Self-pay | Admitting: Family Medicine

## 2021-04-12 ENCOUNTER — Ambulatory Visit (INDEPENDENT_AMBULATORY_CARE_PROVIDER_SITE_OTHER): Payer: Federal, State, Local not specified - PPO

## 2021-04-12 VITALS — BP 128/69 | HR 63 | Temp 96.4°F

## 2021-04-12 DIAGNOSIS — R0609 Other forms of dyspnea: Secondary | ICD-10-CM

## 2021-04-12 DIAGNOSIS — I480 Paroxysmal atrial fibrillation: Secondary | ICD-10-CM | POA: Diagnosis not present

## 2021-04-12 DIAGNOSIS — R531 Weakness: Secondary | ICD-10-CM | POA: Diagnosis not present

## 2021-04-12 DIAGNOSIS — R0600 Dyspnea, unspecified: Secondary | ICD-10-CM | POA: Diagnosis not present

## 2021-04-12 DIAGNOSIS — R42 Dizziness and giddiness: Secondary | ICD-10-CM

## 2021-04-12 DIAGNOSIS — R5382 Chronic fatigue, unspecified: Secondary | ICD-10-CM

## 2021-04-12 MED ORDER — ALBUTEROL SULFATE HFA 108 (90 BASE) MCG/ACT IN AERS: 2.0000 | INHALATION_SPRAY | Freq: Four times a day (QID) | RESPIRATORY_TRACT | 0 refills | Status: DC | PRN

## 2021-04-12 NOTE — Progress Notes (Signed)
Subjective: CC: Chronic fatigue, dizziness, dyspnea on exertion PCP: Janora Norlander, DO YTK:[id number] Heather Keith is a 70 y.o. female presenting to clinic today for:  1.  Fatigue, dizziness, dyspnea on exertion Patient reports that she has had ongoing fatigue that is progressive and associated with dyspnea on exertion, dizziness and sensation of generalized weakness.  This has been ongoing for about the last 2 months.  She has had various tick bites and would like to have a Lyme panel amongst other labs that were recommended by her daughter, who is a Librarian, academic.  She brings a list of requested labs today.  Denies any tachycardia, chest pain.  Sometimes she will get pedal edema but it seems to be after she is consumed an electrolyte beverage multiple times.  She has been working on increased salt in efforts to improve blood pressure but she continues to have hypotension down into the systolics of 81E to low 751Z.  She has in fact skipped her atenolol on the days that her blood pressure is too low.  She has continued the flecainide as prescribed by her cardiologist.  She does admit to some dull headaches and some sweating in her hair at night.  No hemoptysis, measured fevers.  Her dizziness occurs independent of position changes.  She has been trying to hydrate is much as possible but admits that she still has room for improvement.  She has had some dyspnea on exertion that she describes as shortness of breath with walking normal distances.  She often starts feeling extremely exhausted after trying to walk from her mailbox.  This is definitely a new change for her.  No orthopnea.   ROS: Per HPI  Allergies  Allergen Reactions   Penicillins Rash   Past Medical History:  Diagnosis Date   HTN (hypertension)    (patient denies)   Neck pain    Paroxysmal A-fib ([place])    CHADS2vasc =1   PONV (postoperative nausea and vomiting)    and had a siezure once at dentist    Typical atrial  flutter ([place])    Vasovagal syncope 04/28/13    Current Outpatient Medications:    acetaminophen (TYLENOL) 500 MG tablet, Take 500 mg by mouth every 6 (six) hours as needed (pain). , Disp: , Rfl:    aspirin 81 MG tablet, Take 81 mg by mouth daily. , Disp: , Rfl:    atenolol (TENORMIN) 25 MG tablet, Take 0.5 tablets (12.5 mg total) by mouth daily., Disp: 45 tablet, Rfl: 3   Coenzyme Q10 (CO Q-10) 400 MG CAPS, Take 1 tablet by mouth daily., Disp: , Rfl:    dexamethasone (DECADRON) 2 MG tablet, Take 6 mg by mouth 2 (two) times daily., Disp: , Rfl:    escitalopram (LEXAPRO) 10 MG tablet, Take 1 tablet (10 mg total) by mouth daily., Disp: 90 tablet, Rfl: 3   flecainide (TAMBOCOR) 100 MG tablet, TAKE 1 TABLET BY MOUTH TWICE DAILY, Disp: 60 tablet, Rfl: 11   fluticasone (FLONASE) 50 MCG/ACT nasal spray, Place 2 sprays into both nostrils daily., Disp: 16 g, Rfl: 6   LORATADINE PO, Take 1 tablet by mouth daily., Disp: , Rfl:    mirabegron ER (MYRBETRIQ) 25 MG TB24 tablet, Take 1 tablet (25 mg total) by mouth daily., Disp: 21 tablet, Rfl: 0   Multiple Vitamins-Minerals (MULTIVITAMIN WITH MINERALS) tablet, Take 1 tablet by mouth daily., Disp: , Rfl:    Omega-3 1000 MG CAPS, Take by mouth., Disp: , Rfl:  pravastatin (PRAVACHOL) 40 MG tablet, Take 1 tablet (40 mg total) by mouth daily., Disp: 90 tablet, Rfl: 3   Probiotic Product (PROBIOTIC PO), Take 1 tablet by mouth daily., Disp: , Rfl:    zolpidem (AMBIEN) 5 MG tablet, Take 1 tablet (5 mg total) by mouth at bedtime as needed for sleep., Disp: 30 tablet, Rfl: 5 Social History   Socioeconomic History   Marital status: Widowed    Spouse name: Not on file   Number of children: Not on file   Years of education: Not on file   Highest education level: Not on file  Occupational History   Not on file  Tobacco Use   Smoking status: Never   Smokeless tobacco: Never  Vaping Use   Vaping Use: Never used  Substance and Sexual Activity   Alcohol use: Yes     Comment: occasionally   Drug use: No   Sexual activity: Not on file  Other Topics Concern   Not on file  Social History Narrative   Not on file   Social Determinants of Health   Financial Resource Strain: Not on file  Food Insecurity: Not on file  Transportation Needs: Not on file  Physical Activity: Not on file  Stress: Not on file  Social Connections: Not on file  Intimate Partner Violence: Not on file   Family History  Problem Relation Age of Onset   Hodgkin's lymphoma Mother    Heart attack Father 13   Diabetes Maternal Grandfather     Objective: Office vital signs reviewed. BP 128/69   Pulse 63   Temp (!) 96.4 F (35.8 C)   SpO2 99%   Physical Examination:  General: Awake, alert, nontoxic but slightly tired appearing, No acute distress HEENT: No exophthalmos, goiter.  No palpable thyroid masses nor lymphadenopathy.  She does have a small conjunctival hemorrhage noted along the medial aspect of the right eye. Cardio: Bradycardic with seemingly regular rhythm, V6P7 heard, systolic murmur appreciated Pulm: Mild expiratory wheezes at the bases of the lungs.  No rhonchi or rails.  She has normal work of breathing on room air.  She has good air movement and otherwise clear lung fields. Extremities: warm, well perfused, No edema, cyanosis or clubbing; +2 pulses bilaterally MSK: Ambulating independently with normal tone  Assessment/ Plan: 70 y.o. female   Chronic fatigue - Plan: CMP14+EGFR, Thyroid Panel With TSH, CBC, Vitamin B12, EKG 12-Lead, Lyme Disease Serology w/Reflex, VITAMIN D 25 Hydroxy (Vit-D Deficiency, Fractures)  Dizziness - Plan: CMP14+EGFR, CBC, Vitamin B12, Lyme Disease Serology w/Reflex, VITAMIN D 25 Hydroxy (Vit-D Deficiency, Fractures)  Feeling weak - Plan: CMP14+EGFR, Thyroid Panel With TSH, CBC, ANA w/Reflex if Positive, C-reactive protein, Sedimentation Rate, Lyme Disease Serology w/Reflex, VITAMIN D 25 Hydroxy (Vit-D Deficiency, Fractures),  Magnesium  Dyspnea on exertion - Plan: D-dimer, quantitative, DG Chest 2 View, albuterol (VENTOLIN HFA) 108 (90 Base) MCG/ACT inhaler, ECHOCARDIOGRAM COMPLETE  Paroxysmal atrial fibrillation (HCC) - Plan: CMP14+EGFR, Thyroid Panel With TSH, CBC, EKG 12-Lead, Magnesium, DG Chest 2 View, ECHOCARDIOGRAM COMPLETE  Uncertain etiology of her symptoms.  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  She has some known pulmonary nodules that were noted on previous CT and does have a history of atrial fibrillation.  Does not sound like she is had any runs of atrial fibrillation to explain her symptoms.  EKG did not show any acute events but I will CC to her  cardiologist for secondary review and opinion.  I did recommend that she discuss her atenolol with him.  I worry that her blood pressure is too low to continue on the atenolol.  We discussed that my previous discussion with Dr. Lovena Le was that she be switched to metoprolol if blood pressure did not increase enough with added sodium to her diet.  She wanted to see him before making this change.  I am going to proceed with evaluation for infectious etiology, metabolic etiology and autoimmune etiology of her symptoms.  These labs have been ordered.  I have concerns that her dyspnea on exertion may be cardiac in nature however.  She did not demonstrate evidence of fluid overload.  Chest x-ray was obtained today for completion and I have added albuterol inhaler to use as needed shortness of breath.  She is to monitor her heart rate closely and discontinue if not tolerating the medication.  An echocardiogram also ordered given cardiac history and recent COVID infection.  Additionally, she requested a D-dimer be added.  I am not sure the utility of this and I informed her that at this point I do not have a clinical indication to order given the longevity of symptoms I think it would be very unusual  to have a pulmonary embolism this far out without other hemodynamic changes but she did prefer to have this ordered and was willing to pay out-of-pocket for it.  No orders of the defined types were placed in this encounter.  No orders of the defined types were placed in this encounter.    Janora Norlander, DO [place] [phone]

## 2021-04-12 NOTE — Patient Instructions (Signed)
You had labs performed today.  You will be contacted with the results of the labs once they are available, usually in the next 3 business days for routine lab work.  If you have an active my chart account, they will be released to your MyChart.  If you prefer to have these labs released to you via telephone, please let us know.  My worry is that you are experiencing complications of COVID-19.  I did hear some mild expiratory wheezes on your exam today which is very atypical for you.  I am sending in an albuterol inhaler to use.  I would try it once and see if this improves your breathing with exertion.  As long as its not aggravating your heart rate I think it is okay to continue using if needed.  I will reach out to Dr. Ladona Ridgel about your low blood pressure.  I do worry that the medication may be exacerbating some of your symptoms.  You had a chest x-ray performed today to look for any evidence of cardiac abnormalities and or pulmonary abnormalities.  You will be contacted once this is read formally by the radiologist

## 2021-04-13 LAB — C-REACTIVE PROTEIN: CRP: 7 mg/L (ref 0–10)

## 2021-04-13 LAB — CMP14+EGFR
ALT: 44 IU/L — ABNORMAL HIGH (ref 0–32)
AST: 64 IU/L — ABNORMAL HIGH (ref 0–40)
Albumin/Globulin Ratio: 1.4 (ref 1.2–2.2)
Albumin: 3.7 g/dL — ABNORMAL LOW (ref 3.8–4.8)
Alkaline Phosphatase: 78 IU/L (ref 44–121)
BUN/Creatinine Ratio: 13 (ref 12–28)
BUN: 9 mg/dL (ref 8–27)
Bilirubin Total: 0.3 mg/dL (ref 0.0–1.2)
CO2: 26 mmol/L (ref 20–29)
Calcium: 9 mg/dL (ref 8.7–10.3)
Chloride: 101 mmol/L (ref 96–106)
Creatinine, Ser: 0.69 mg/dL (ref 0.57–1.00)
Globulin, Total: 2.6 g/dL (ref 1.5–4.5)
Glucose: 94 mg/dL (ref 65–99)
Potassium: 4.5 mmol/L (ref 3.5–5.2)
Sodium: 138 mmol/L (ref 134–144)
Total Protein: 6.3 g/dL (ref 6.0–8.5)
eGFR: 93 mL/min/{1.73_m2} (ref >59)

## 2021-04-13 LAB — VITAMIN D 25 HYDROXY (VIT D DEFICIENCY, FRACTURES): Vit D, 25-Hydroxy: 51.5 ng/mL (ref 30.0–100.0)

## 2021-04-13 LAB — MAGNESIUM: Magnesium: 1.9 mg/dL (ref 1.6–2.3)

## 2021-04-13 LAB — D-DIMER, QUANTITATIVE: D-DIMER: 6.58 mg/L FEU — ABNORMAL HIGH (ref 0.00–0.49)

## 2021-04-13 LAB — SEDIMENTATION RATE: Sed Rate: 7 mm/hr (ref 0–40)

## 2021-04-13 LAB — CBC
Hematocrit: 40.2 % (ref 34.0–46.6)
Hemoglobin: 12.6 g/dL (ref 11.1–15.9)
MCH: 27.4 pg (ref 26.6–33.0)
MCHC: 31.3 g/dL — ABNORMAL LOW (ref 31.5–35.7)
MCV: 87 fL (ref 79–97)
Platelets: 230 10*3/uL (ref 150–450)
RBC: 4.6 x10E6/uL (ref 3.77–5.28)
RDW: 14.5 % (ref 11.7–15.4)
WBC: 9.8 10*3/uL (ref 3.4–10.8)

## 2021-04-13 LAB — THYROID PANEL WITH TSH
Free Thyroxine Index: 2.3 (ref 1.2–4.9)
T3 Uptake Ratio: 24 % (ref 24–39)
T4, Total: 9.6 ug/dL (ref 4.5–12.0)
TSH: 2.16 u[IU]/mL (ref 0.450–4.500)

## 2021-04-13 LAB — ANA W/REFLEX IF POSITIVE: Anti Nuclear Antibody (ANA): NEGATIVE

## 2021-04-13 LAB — VITAMIN B12: Vitamin B-12: >2000 pg/mL — ABNORMAL HIGH (ref 232–1245)

## 2021-04-13 LAB — LYME DISEASE SEROLOGY W/REFLEX: Lyme Total Antibody EIA: NEGATIVE

## 2021-04-14 ENCOUNTER — Other Ambulatory Visit: Payer: Self-pay | Admitting: Family Medicine

## 2021-04-14 ENCOUNTER — Other Ambulatory Visit: Payer: Self-pay

## 2021-04-14 ENCOUNTER — Ambulatory Visit (HOSPITAL_COMMUNITY)
Admission: RE | Admit: 2021-04-14 | Discharge: 2021-04-14 | Disposition: A | Discharge status: Home / Self Care | Payer: Federal, State, Local not specified - PPO | Source: Ambulatory Visit | Attending: Family Medicine | Admitting: Family Medicine

## 2021-04-14 DIAGNOSIS — R0600 Dyspnea, unspecified: Secondary | ICD-10-CM | POA: Diagnosis present

## 2021-04-14 DIAGNOSIS — R42 Dizziness and giddiness: Secondary | ICD-10-CM | POA: Insufficient documentation

## 2021-04-14 DIAGNOSIS — R0609 Other forms of dyspnea: Secondary | ICD-10-CM

## 2021-04-14 DIAGNOSIS — R7989 Other specified abnormal findings of blood chemistry: Secondary | ICD-10-CM

## 2021-04-14 DIAGNOSIS — I8290 Acute embolism and thrombosis of unspecified vein: Secondary | ICD-10-CM

## 2021-04-14 MED ORDER — IOHEXOL 350 MG/ML SOLN
100.0000 mL | Freq: Once | INTRAVENOUS | Status: AC | PRN
  Administered 2021-04-14: 100 mL via INTRAVENOUS

## 2021-04-14 MED ORDER — RIVAROXABAN (XARELTO) VTE STARTER PACK (15 & 20 MG): ORAL_TABLET | ORAL | 0 refills | Status: DC

## 2021-04-14 NOTE — Progress Notes (Signed)
Discussed the results that were told to me via telephone with regards to her venous ultrasound.  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 "  Meds ordered this encounter  Medications   RIVAROXABAN (XARELTO) VTE STARTER PACK (15 & 20 MG)    Sig: Follow package directions: Take one 15mg  tablet by mouth twice a day. On day 22, switch to one 20mg  tablet once a day. Take with food.    Dispense:  51 each    Refill:  0    We will start her on a Xarelto starter pack.  I have given her some samples if she is unable to secure this today of the 15 mg which she will take twice daily and switch to 20 mg daily.  She has an appointment to follow-up with me next week I will prescribe the 20 mg if she is tolerating the Xarelto at that time.  She has had some issues with intolerance to Eliquis in the past.  We discussed the risk of progression to more significant DVT without treatment given degree of involvement at the greater saphenous vein (which sounds like >3cm involvement based on report).

## 2021-04-19 ENCOUNTER — Telehealth: Payer: Self-pay | Admitting: Internal Medicine

## 2021-04-19 ENCOUNTER — Other Ambulatory Visit: Payer: Self-pay

## 2021-04-19 ENCOUNTER — Other Ambulatory Visit: Payer: Federal, State, Local not specified - PPO

## 2021-04-19 ENCOUNTER — Ambulatory Visit: Payer: Federal, State, Local not specified - PPO | Admitting: Family Medicine

## 2021-04-19 ENCOUNTER — Other Ambulatory Visit: Payer: Self-pay | Admitting: Family Medicine

## 2021-04-19 DIAGNOSIS — R0600 Dyspnea, unspecified: Secondary | ICD-10-CM

## 2021-04-19 DIAGNOSIS — R7989 Other specified abnormal findings of blood chemistry: Secondary | ICD-10-CM

## 2021-04-19 NOTE — Telephone Encounter (Signed)
Pt c/o BP issue: STAT if pt c/o blurred vision, one-sided weakness or slurred speech  1. What are your last 5 BP readings? 141/82, 117/67, 99/65, 134/76 HR 77-94  2. Are you having any other symptoms (ex. Dizziness, headache, blurred vision, passed out)? Dizziness and SOB when moving around, weak,   3. What is your BP issue? Patient states her BP has been fluctuating from medium high to low. She states her PCP was supposed to get in touch with Dr. Ladona Ridgel. She states she has also been having dizziness, SOB when moving around, and weak. Patient was not SOB while on the phone. She says she had a CT on her head because she fell twice and her chest. She states she also had an ultrasound on both legs and they found a blood clot in her right calf. She states it is probably from her having covid. She states her liver enzymes are also high. She says her PCP discussed a possible echo. Patient refused an appointment with a PA. I did not see anything with Dr. Ladona Ridgel until October. She would like to speak with a nurse about her symptoms.

## 2021-04-20 ENCOUNTER — Ambulatory Visit (HOSPITAL_COMMUNITY): Payer: Federal, State, Local not specified - PPO | Attending: Cardiology

## 2021-04-20 DIAGNOSIS — R0600 Dyspnea, unspecified: Secondary | ICD-10-CM | POA: Diagnosis not present

## 2021-04-20 LAB — ACUTE HEP PANEL AND HEP B SURFACE AB
Hep A IgM: NEGATIVE
Hep B C IgM: NEGATIVE
Hep C Virus Ab: 0.1 s/co ratio (ref 0.0–0.9)
Hepatitis B Surf Ab Quant: <3.1 m[IU]/mL — ABNORMAL LOW (ref >9.9)
Hepatitis B Surface Ag: NEGATIVE

## 2021-04-20 LAB — ECHOCARDIOGRAM COMPLETE
Area-P 1/2: 3.85 cm2
S' Lateral: 2.5 cm

## 2021-04-20 LAB — CMP14+EGFR
ALT: 43 IU/L — ABNORMAL HIGH (ref 0–32)
AST: 55 IU/L — ABNORMAL HIGH (ref 0–40)
Albumin/Globulin Ratio: 1.5 (ref 1.2–2.2)
Albumin: 3.7 g/dL — ABNORMAL LOW (ref 3.8–4.8)
Alkaline Phosphatase: 78 IU/L (ref 44–121)
BUN/Creatinine Ratio: 13 (ref 12–28)
BUN: 10 mg/dL (ref 8–27)
Bilirubin Total: 0.4 mg/dL (ref 0.0–1.2)
CO2: 22 mmol/L (ref 20–29)
Calcium: 9 mg/dL (ref 8.7–10.3)
Chloride: 104 mmol/L (ref 96–106)
Creatinine, Ser: 0.75 mg/dL (ref 0.57–1.00)
Globulin, Total: 2.4 g/dL (ref 1.5–4.5)
Glucose: 109 mg/dL — ABNORMAL HIGH (ref 65–99)
Potassium: 4.2 mmol/L (ref 3.5–5.2)
Sodium: 140 mmol/L (ref 134–144)
Total Protein: 6.1 g/dL (ref 6.0–8.5)
eGFR: 86 mL/min/{1.73_m2} (ref >59)

## 2021-04-20 NOTE — Telephone Encounter (Signed)
Returned call to Pt.  Pt had covid in January of this year.  States she has been short of breath for 2-3 months.  She attributes feeling of fatigue to atenolol.  She states her blood pressures have varied.    Lowest blood pressure she endorses is 95/65.    Encouraged Pt to have echo as ordered by her PCP.  She would like that scheduled at [facility] office.  Advised would discuss with Dr. Ladona Ridgel and call back Pt tomorrow with further advisement.

## 2021-04-20 NOTE — Telephone Encounter (Signed)
Spoke with Dr. Nadine Counts.  Ok to change order for ECHO to [facility] office per Pt request.  Per Dr. Nadine Counts Pt will take 10 mg Xarelto for 45 days for blood clot.

## 2021-04-20 NOTE — Progress Notes (Signed)
Pt aware = 2 week appt made

## 2021-04-24 NOTE — Telephone Encounter (Signed)
Discussed with Dr. Nadine Counts.  Order placed for CPX.  Pt aware.

## 2021-05-01 DEATH — deceased

## 2021-05-05 ENCOUNTER — Encounter: Payer: Self-pay | Admitting: Family Medicine

## 2021-05-05 ENCOUNTER — Other Ambulatory Visit: Payer: Self-pay

## 2021-05-05 ENCOUNTER — Ambulatory Visit: Payer: Federal, State, Local not specified - PPO | Admitting: Family Medicine

## 2021-05-05 VITALS — BP 114/64 | HR 74 | Temp 96.2°F | Ht 70.0 in | Wt 195.2 lb

## 2021-05-05 DIAGNOSIS — I8290 Acute embolism and thrombosis of unspecified vein: Secondary | ICD-10-CM

## 2021-05-05 DIAGNOSIS — R7989 Other specified abnormal findings of blood chemistry: Secondary | ICD-10-CM | POA: Diagnosis not present

## 2021-05-05 LAB — HEMOGLOBIN, FINGERSTICK: Hemoglobin: 11.6 g/dL (ref 11.1–15.9)

## 2021-05-05 MED ORDER — RIVAROXABAN 10 MG PO TABS: 10.0000 mg | ORAL_TABLET | Freq: Every day | ORAL | 0 refills | Status: DC

## 2021-05-05 NOTE — Progress Notes (Signed)
Subjective: CC: Venous thromboembolism PCP: Raliegh Ip, DO TML:YYTKPT Heather Keith is a 70 y.o. female presenting to clinic today for:  1.  Venous thromboembolism Patient was seen in July for nonspecific symptoms and we obtained a D-dimer which showed positive.  She had venous ultrasounds performed which did show occlusive thrombus in the right great saphenous vein that extended from mid calf through distal thigh.  I consulted with vascular surgery through [facility] and they agreed with anticoagulation given extent of superficial clot and propensity for this to transition into a true DVT.  The recommendation was to continue Xarelto 10 mg daily for up to 45 days with reassessment.  She has been taking the medications as directed.  No bleeding.  Overall she is feeling better.  She does admit that since stopping the atenolol that she overall has felt better- dizziness, fatigue have improved.  Continues to have some mild shortness of breath but its not as severe as previous.  Has a stress test coming up soon.  ROS: Per HPI  Allergies  Allergen Reactions   Penicillins Rash   Past Medical History:  Diagnosis Date   HTN (hypertension)    (patient denies)   Neck pain    Paroxysmal A-fib (HCC)    CHADS2vasc =1   PONV (postoperative nausea and vomiting)    and had a siezure once at dentist    Typical atrial flutter (HCC)    Vasovagal syncope 04/28/13    Current Outpatient Medications:    acetaminophen (TYLENOL) 500 MG tablet, Take 500 mg by mouth every 6 (six) hours as needed (pain). , Disp: , Rfl:    albuterol (VENTOLIN HFA) 108 (90 Base) MCG/ACT inhaler, Inhale 2 puffs into the lungs every 6 (six) hours as needed for wheezing or shortness of breath., Disp: 8 g, Rfl: 0   aspirin 81 MG tablet, Take 81 mg by mouth daily. , Disp: , Rfl:    Coenzyme Q10 (CO Q-10) 400 MG CAPS, Take 1 tablet by mouth daily., Disp: , Rfl:    escitalopram (LEXAPRO) 10 MG tablet, Take 1 tablet (10 mg  total) by mouth daily., Disp: 90 tablet, Rfl: 3   flecainide (TAMBOCOR) 100 MG tablet, TAKE 1 TABLET BY MOUTH TWICE DAILY, Disp: 60 tablet, Rfl: 11   fluticasone (FLONASE) 50 MCG/ACT nasal spray, Place 2 sprays into both nostrils daily., Disp: 16 g, Rfl: 6   LORATADINE PO, Take 1 tablet by mouth daily., Disp: , Rfl:    Multiple Vitamins-Minerals (MULTIVITAMIN WITH MINERALS) tablet, Take 1 tablet by mouth daily., Disp: , Rfl:    Omega-3 1000 MG CAPS, Take by mouth., Disp: , Rfl:    pravastatin (PRAVACHOL) 40 MG tablet, Take 1 tablet (40 mg total) by mouth daily., Disp: 90 tablet, Rfl: 3   Probiotic Product (PROBIOTIC PO), Take 1 tablet by mouth daily., Disp: , Rfl:    RIVAROXABAN (XARELTO) VTE STARTER PACK (15 & 20 MG), Follow package directions: Take one 15mg  tablet by mouth twice a day. On day 22, switch to one 20mg  tablet once a day. Take with food., Disp: 51 each, Rfl: 0   zolpidem (AMBIEN) 5 MG tablet, Take 1 tablet (5 mg total) by mouth at bedtime as needed for sleep., Disp: 30 tablet, Rfl: 5 Social History   Socioeconomic History   Marital status: Widowed    Spouse name: Not on file   Number of children: Not on file   Years of education: Not on file   Highest  education level: Not on file  Occupational History   Not on file  Tobacco Use   Smoking status: Never   Smokeless tobacco: Never  Vaping Use   Vaping Use: Never used  Substance and Sexual Activity   Alcohol use: Yes    Comment: occasionally   Drug use: No   Sexual activity: Not on file  Other Topics Concern   Not on file  Social History Narrative   Not on file   Social Determinants of Health   Financial Resource Strain: Not on file  Food Insecurity: Not on file  Transportation Needs: Not on file  Physical Activity: Not on file  Stress: Not on file  Social Connections: Not on file  Intimate Partner Violence: Not on file   Family History  Problem Relation Age of Onset   Hodgkin's lymphoma Mother    Heart  attack Father 6   Diabetes Maternal Grandfather     Objective: Office vital signs reviewed. BP 114/64   Pulse 74   Temp (!) 96.2 F (35.7 C)   Ht 5\' 10"  (1.778 m)   Wt 195 lb 3.2 oz (88.5 kg)   SpO2 97%   BMI 28.01 kg/m   Physical Examination:  General: Awake, alert, well nourished, No acute distress HEENT: Normal, sclera white Cardio: regular rate and rhythm, S1S2 heard, no murmurs appreciated Pulm: clear to auscultation bilaterally, no wheezes, rhonchi or rales; normal work of breathing on room air  Assessment/ Plan: 70 y.o. female   Venous thromboembolism (VTE) confirmed by diagnostic testing - Plan: Hemoglobin, fingerstick, 66 Venous Img Lower Unilateral Right  Elevated liver function tests - Plan: Hepatic Function Panel  Reviewed the recommendations by the vascular specialist I spoke to.  I have transitioned her over to Xarelto 10 mg daily to complete a total of 45 days of anticoagulation.  Venous ultrasound has been ordered to be completed in 1 month.  The recommendation was that if the thromboembolism continues to appear acute or if it is extended, we will increase the Xarelto dose to 20 mg daily and complete a 19-month course of anticoagulation.  She was good understanding of the plan.  We will repeat liver function tests, which were downtrending but still elevated on 20 July.  No orders of the defined types were placed in this encounter.  No orders of the defined types were placed in this encounter.    22 July, DO [facility] [phone]

## 2021-05-05 NOTE — Patient Instructions (Signed)
As per recommendations by the vascular surgeon I consulted for you, he recommended a total 45-day course of anticoagulants and recommended 10 mg of Xarelto going forward.  We will plan for repeat venous ultrasound to reevaluate your thrombosis.  Further anticoagulation pending that study

## 2021-05-06 LAB — HEPATIC FUNCTION PANEL
ALT: 18 IU/L (ref 0–32)
AST: 28 IU/L (ref 0–40)
Albumin: 3.8 g/dL (ref 3.8–4.8)
Alkaline Phosphatase: 80 IU/L (ref 44–121)
Bilirubin Total: 0.5 mg/dL (ref 0.0–1.2)
Bilirubin, Direct: 0.14 mg/dL (ref 0.00–0.40)
Total Protein: 6.5 g/dL (ref 6.0–8.5)

## 2021-05-08 ENCOUNTER — Telehealth: Payer: Self-pay | Admitting: Internal Medicine

## 2021-05-08 NOTE — Telephone Encounter (Signed)
Returned call to Pt.  Left message requesting call back if she has any questions about CPX.

## 2021-05-08 NOTE — Telephone Encounter (Signed)
This morning Heather Keith called me for some concerns about her CPX test that was ordered for her. Patient said she has been experiencing  high HR in the 100s when walking her dog and hasn't been doing strenuous exercises. She said this has been happening since stopping her Atenolol. She is afraid that the CPX text will drive her into AFIB and would like some advice on what to do/expect.   Please call patient to discuss this issue.

## 2021-05-09 NOTE — Telephone Encounter (Signed)
I have not had the experience that exercise is likely to bring on atrial fib.

## 2021-05-24 ENCOUNTER — Ambulatory Visit: Payer: Federal, State, Local not specified - PPO | Admitting: Family Medicine

## 2021-05-25 ENCOUNTER — Other Ambulatory Visit: Payer: Self-pay

## 2021-05-25 ENCOUNTER — Ambulatory Visit (HOSPITAL_COMMUNITY): Payer: Federal, State, Local not specified - PPO | Attending: Cardiology

## 2021-05-25 DIAGNOSIS — R0600 Dyspnea, unspecified: Secondary | ICD-10-CM | POA: Insufficient documentation

## 2021-05-30 ENCOUNTER — Ambulatory Visit: Payer: Federal, State, Local not specified - PPO | Admitting: Family Medicine

## 2021-06-06 ENCOUNTER — Other Ambulatory Visit: Payer: Self-pay

## 2021-06-06 ENCOUNTER — Ambulatory Visit (HOSPITAL_COMMUNITY)
Admission: RE | Admit: 2021-06-06 | Discharge: 2021-06-06 | Disposition: A | Discharge status: Home / Self Care | Payer: Federal, State, Local not specified - PPO | Source: Ambulatory Visit | Attending: Family Medicine | Admitting: Family Medicine

## 2021-06-06 DIAGNOSIS — I8290 Acute embolism and thrombosis of unspecified vein: Secondary | ICD-10-CM | POA: Diagnosis present

## 2021-06-13 ENCOUNTER — Ambulatory Visit: Payer: Federal, State, Local not specified - PPO | Admitting: Family Medicine

## 2021-06-13 ENCOUNTER — Encounter: Payer: Self-pay | Admitting: Family Medicine

## 2021-06-13 ENCOUNTER — Other Ambulatory Visit: Payer: Self-pay

## 2021-06-13 VITALS — BP 110/67 | HR 84 | Temp 97.3°F | Ht 70.0 in | Wt 194.2 lb

## 2021-06-13 DIAGNOSIS — I480 Paroxysmal atrial fibrillation: Secondary | ICD-10-CM

## 2021-06-13 DIAGNOSIS — I8290 Acute embolism and thrombosis of unspecified vein: Secondary | ICD-10-CM

## 2021-06-13 MED ORDER — RIVAROXABAN 20 MG PO TABS: 20.0000 mg | ORAL_TABLET | Freq: Every day | ORAL | 3 refills | Status: DC

## 2021-06-13 NOTE — Progress Notes (Signed)
Subjective: CC: VTE f/u PCP: Raliegh Ip, DO Heather Keith is a 70 y.o. female presenting to clinic today for:  1. Superficial VTE/ afib Resolving resolved on most recent repeat venous ultrasound.  She is status post treatment with anticoagulation over the last couple of months.  Liver function tests have normalized.  She actually felt better when she was on Xarelto.  She has been off of this since 2 September.  She is amenable to going back on it for atrial fibrillation.  She apparently has been urged to be on some type of blood thinner given her paroxysmal atrial fibrillation but has been reluctant to do so given intolerance of Eliquis in the past.  She likely had no bleeding issues with this medicine.   ROS: Per HPI  Allergies  Allergen Reactions   Penicillins Rash   Past Medical History:  Diagnosis Date   HTN (hypertension)    (patient denies)   Neck pain    Paroxysmal A-fib (HCC)    CHADS2vasc =1   PONV (postoperative nausea and vomiting)    and had a siezure once at dentist    Typical atrial flutter (HCC)    Vasovagal syncope 04/28/13    Current Outpatient Medications:    acetaminophen (TYLENOL) 500 MG tablet, Take 500 mg by mouth every 6 (six) hours as needed (pain). , Disp: , Rfl:    albuterol (VENTOLIN HFA) 108 (90 Base) MCG/ACT inhaler, Inhale 2 puffs into the lungs every 6 (six) hours as needed for wheezing or shortness of breath., Disp: 8 g, Rfl: 0   aspirin 81 MG tablet, Take 81 mg by mouth daily.  (Patient not taking: Reported on 05/05/2021), Disp: , Rfl:    Coenzyme Q10 (CO Q-10) 400 MG CAPS, Take 1 tablet by mouth daily., Disp: , Rfl:    escitalopram (LEXAPRO) 10 MG tablet, Take 1 tablet (10 mg total) by mouth daily., Disp: 90 tablet, Rfl: 3   flecainide (TAMBOCOR) 100 MG tablet, TAKE 1 TABLET BY MOUTH TWICE DAILY, Disp: 60 tablet, Rfl: 11   fluticasone (FLONASE) 50 MCG/ACT nasal spray, Place 2 sprays into both nostrils daily., Disp: 16 g, Rfl: 6    LORATADINE PO, Take 1 tablet by mouth daily., Disp: , Rfl:    Multiple Vitamins-Minerals (MULTIVITAMIN WITH MINERALS) tablet, Take 1 tablet by mouth daily., Disp: , Rfl:    Omega-3 1000 MG CAPS, Take by mouth., Disp: , Rfl:    pravastatin (PRAVACHOL) 40 MG tablet, Take 1 tablet (40 mg total) by mouth daily., Disp: 90 tablet, Rfl: 3   Probiotic Product (PROBIOTIC PO), Take 1 tablet by mouth daily., Disp: , Rfl:    rivaroxaban (XARELTO) 10 MG TABS tablet, Take 1 tablet (10 mg total) by mouth daily., Disp: 28 tablet, Rfl: 0   zolpidem (AMBIEN) 5 MG tablet, Take 1 tablet (5 mg total) by mouth at bedtime as needed for sleep., Disp: 30 tablet, Rfl: 5 Social History   Socioeconomic History   Marital status: Widowed    Spouse name: Not on file   Number of children: Not on file   Years of education: Not on file   Highest education level: Not on file  Occupational History   Not on file  Tobacco Use   Smoking status: Never   Smokeless tobacco: Never  Vaping Use   Vaping Use: Never used  Substance and Sexual Activity   Alcohol use: Yes    Comment: occasionally   Drug use: No   Sexual  activity: Not on file  Other Topics Concern   Not on file  Social History Narrative   Not on file   Social Determinants of Health   Financial Resource Strain: Not on file  Food Insecurity: Not on file  Transportation Needs: Not on file  Physical Activity: Not on file  Stress: Not on file  Social Connections: Not on file  Intimate Partner Violence: Not on file   Family History  Problem Relation Age of Onset   Hodgkin's lymphoma Mother    Heart attack Father 31   Diabetes Maternal Grandfather     Objective: Office vital signs reviewed. BP 110/67   Pulse 84   Temp (!) 97.3 F (36.3 C)   Ht 5\' 10"  (1.778 m)   Wt 194 lb 3.2 oz (88.1 kg)   SpO2 96%   BMI 27.86 kg/m   Physical Examination:  General: Awake, alert, well nourished, No acute distress HEENT: Normal, sclera white, MMM Cardio:  regular rate and rhythm, S1S2 heard, no murmurs appreciated Pulm: clear to auscultation bilaterally, no wheezes, rhonchi or rales; normal work of breathing on room air    Venous Img Lower Unilateral Right  Result Date: 06/06/2021 CLINICAL DATA:  History of superficial thrombosis of the great saphenous vein. Patient has been on anticoagulation. Follow-up evaluation. EXAM: RIGHT LOWER EXTREMITY VENOUS DOPPLER ULTRASOUND TECHNIQUE: Gray-scale sonography with compression, as well as color and duplex ultrasound, were performed to evaluate the deep venous system(s) from the level of the common femoral vein through the popliteal and proximal calf veins. COMPARISON:  Prior duplex venous ultrasound 04/14/2021 FINDINGS: VENOUS Normal compressibility of the common femoral, superficial femoral, and popliteal veins, as well as the visualized calf veins. Visualized portions of profunda femoral vein and great saphenous vein unremarkable. No filling defects to suggest DVT on grayscale or color Doppler imaging. Doppler waveforms show normal direction of venous flow, normal respiratory plasticity and response to augmentation. Limited views of the contralateral common femoral vein are unremarkable. OTHER None. Limitations: none IMPRESSION: No evidence of deep or superficial venous thrombosis. Interval resolution of great saphenous vein superficial thrombosis. Electronically Signed   By: 04/16/2021 M.D.   On: 06/06/2021 11:58     Assessment/ Plan: 70 y.o. female   Venous thromboembolism (VTE) confirmed by diagnostic testing  Paroxysmal atrial fibrillation (HCC) - Plan: rivaroxaban (XARELTO) 20 MG TABS tablet  Resolved superficial VTE  Wishes to proceed with Xarelto for prophylaxis in the setting of nonvalvular atrial fibrillation.  Renal function normal.  20 mg sent.  I CCed her cardiologist as FYI and to see if there are any other recommendations.  We will await their recommendation pertaining to daily  aspirin in conjunction with Xarelto.  She is to schedule appointment with cardiology for ongoing follow-up as directed  No orders of the defined types were placed in this encounter.  No orders of the defined types were placed in this encounter.    66, DO [facility] [phone]

## 2021-06-21 ENCOUNTER — Other Ambulatory Visit: Payer: Self-pay | Admitting: Family Medicine

## 2021-06-21 NOTE — Progress Notes (Signed)
Please inform patient that her cardiologist agrees with Xarelto 20mg  daily for stroke prevention.

## 2021-06-21 NOTE — Progress Notes (Signed)
Patient aware and verbalizes understanding. 

## 2021-07-30 ENCOUNTER — Other Ambulatory Visit: Payer: Self-pay | Admitting: Family Medicine

## 2021-07-30 DIAGNOSIS — F5101 Primary insomnia: Secondary | ICD-10-CM

## 2021-08-04 ENCOUNTER — Telehealth: Payer: Self-pay | Admitting: Family Medicine

## 2021-08-04 NOTE — Telephone Encounter (Signed)
  Prescription Request  08/04/2021  Is this a "Controlled Substance" medicine? NO   Have you seen your PCP in the last 2 weeks? NO   If YES, route message to pool  -  If NO, patient needs to be scheduled for appointment.  What is the name of the medication or equipment? zOLPIDEM 5 MG   Have you contacted your pharmacy to request a refill? YES   Which pharmacy would you like this sent to? [facility]   Patient notified that their request is being sent to the clinical staff for review and that they should receive a response within 2 business days.

## 2021-08-07 ENCOUNTER — Telehealth (INDEPENDENT_AMBULATORY_CARE_PROVIDER_SITE_OTHER): Payer: Federal, State, Local not specified - PPO | Admitting: Family Medicine

## 2021-08-07 ENCOUNTER — Encounter: Payer: Self-pay | Admitting: Family Medicine

## 2021-08-07 DIAGNOSIS — F5101 Primary insomnia: Secondary | ICD-10-CM

## 2021-08-07 MED ORDER — ZOLPIDEM TARTRATE 5 MG PO TABS
5.0000 mg | ORAL_TABLET | Freq: Every evening | ORAL | 5 refills | Status: DC | PRN
Start: 2021-08-07 — End: 2021-12-11

## 2021-08-07 NOTE — Telephone Encounter (Signed)
Med IS controlled.  Please put her on for a video visit with me today (ok to double book and I will call her during lunch) as this was not addressed at her previous OVs.

## 2021-08-07 NOTE — Progress Notes (Signed)
MyChart Video visit  Subjective: CC: Insomnia PCP: Raliegh Ip, DO WUJ:[id number] KRYSTN DERMODY is a 70 y.o. female. Patient provides verbal consent for consult held via video.  Due to COVID-19 pandemic this visit was conducted virtually. This visit type was conducted due to national recommendations for restrictions regarding the COVID-19 Pandemic (e.g. social distancing, sheltering in place) in an effort to limit this patient's exposure and mitigate transmission in our community. All issues noted in this document were discussed and addressed.  A physical exam was not performed with this format.   Location of patient: Daughters home Location of provider: [facility] Others present for call: Grandchild  1.  Insomnia Patient reports no complications of Ambien 5 mg as needed.  Denies any excessive daytime sedation, sleepwalking or falls.   ROS: Per HPI  Allergies  Allergen Reactions   Penicillins Rash   Past Medical History:  Diagnosis Date   HTN (hypertension)    (patient denies)   Neck pain    Paroxysmal A-fib (HCC)    CHADS2vasc =1   PONV (postoperative nausea and vomiting)    and had a siezure once at dentist    Typical atrial flutter (HCC)    Vasovagal syncope 04/28/13    Current Outpatient Medications:    acetaminophen (TYLENOL) 500 MG tablet, Take 500 mg by mouth every 6 (six) hours as needed (pain). , Disp: , Rfl:    albuterol (VENTOLIN HFA) 108 (90 Base) MCG/ACT inhaler, Inhale 2 puffs into the lungs every 6 (six) hours as needed for wheezing or shortness of breath., Disp: 8 g, Rfl: 0   aspirin 81 MG tablet, Take 81 mg by mouth daily., Disp: , Rfl:    Coenzyme Q10 (CO Q-10) 400 MG CAPS, Take 1 tablet by mouth daily., Disp: , Rfl:    escitalopram (LEXAPRO) 10 MG tablet, Take 1 tablet (10 mg total) by mouth daily., Disp: 90 tablet, Rfl: 3   flecainide (TAMBOCOR) 100 MG tablet, TAKE 1 TABLET BY MOUTH TWICE DAILY, Disp: 60 tablet, Rfl: 11   fluticasone (FLONASE) 50 MCG/ACT nasal  spray, Place 2 sprays into both nostrils daily., Disp: 16 g, Rfl: 6   LORATADINE PO, Take 1 tablet by mouth daily., Disp: , Rfl:    Multiple Vitamins-Minerals (MULTIVITAMIN WITH MINERALS) tablet, Take 1 tablet by mouth daily., Disp: , Rfl:    Omega-3 1000 MG CAPS, Take by mouth., Disp: , Rfl:    pravastatin (PRAVACHOL) 40 MG tablet, Take 1 tablet (40 mg total) by mouth daily., Disp: 90 tablet, Rfl: 3   Probiotic Product (PROBIOTIC PO), Take 1 tablet by mouth daily., Disp: , Rfl:    rivaroxaban (XARELTO) 20 MG TABS tablet, Take 1 tablet (20 mg total) by mouth daily with supper. Place on file, Disp: 90 tablet, Rfl: 3   zolpidem (AMBIEN) 5 MG tablet, Take 1 tablet (5 mg total) by mouth at bedtime as needed for sleep., Disp: 30 tablet, Rfl: 5  Gen: Well-appearing female in no acute distress  Assessment/ Plan: 70 y.o. female   Primary insomnia - Plan: zolpidem (AMBIEN) 5 MG tablet  Insomnia stable with as needed use of Ambien.  No red flag signs or symptoms.  National narcotic database was reviewed and there were no red flags.  She has follow-up scheduled me in March.  Start time: 11:58am End time: 12:04pm  Total time spent on patient care (including video visit/ documentation): 6 minutes  Kery Batzel Hulen Skains, DO [facility] [phone]

## 2021-08-07 NOTE — Telephone Encounter (Signed)
Patient aware and appt scheduled.

## 2021-08-28 ENCOUNTER — Other Ambulatory Visit: Payer: Self-pay | Admitting: Internal Medicine

## 2021-09-27 ENCOUNTER — Other Ambulatory Visit: Payer: Self-pay | Admitting: Internal Medicine

## 2021-10-16 ENCOUNTER — Other Ambulatory Visit: Payer: Self-pay | Admitting: Internal Medicine

## 2021-11-08 ENCOUNTER — Telehealth: Payer: Self-pay | Admitting: Internal Medicine

## 2021-11-08 MED ORDER — FLECAINIDE ACETATE 100 MG PO TABS: 100.0000 mg | ORAL_TABLET | Freq: Two times a day (BID) | ORAL | 3 refills | Status: DC

## 2021-11-08 NOTE — Telephone Encounter (Signed)
°*  STAT* If patient is at the pharmacy, call can be transferred to refill team.   1. Which medications need to be refilled? (please list name of each medication and dose if known) flecainide (TAMBOCOR) 100 MG tablet  2. Which pharmacy/location (including street and city if local pharmacy) is medication to be sent to?[facility] - [place], [place] - [address]  3. Do they need a 30 day or 90 day supply? 30 day   Only has one day left.

## 2021-11-08 NOTE — Telephone Encounter (Signed)
Pt's medication was sent to pt's pharmacy as requested. Confirmation received.  °

## 2021-11-23 ENCOUNTER — Other Ambulatory Visit: Payer: Self-pay

## 2021-11-23 MED ORDER — FLECAINIDE ACETATE 100 MG PO TABS: 100.0000 mg | ORAL_TABLET | Freq: Two times a day (BID) | ORAL | 2 refills | Status: DC

## 2021-11-23 NOTE — Telephone Encounter (Signed)
Pt's medication was sent to pt's pharmacy as requested. Confirmation received.  °

## 2021-12-11 ENCOUNTER — Ambulatory Visit: Payer: Federal, State, Local not specified - PPO | Admitting: Family Medicine

## 2021-12-11 ENCOUNTER — Encounter: Payer: Self-pay | Admitting: Family Medicine

## 2021-12-11 VITALS — BP 118/67 | HR 81 | Temp 97.5°F | Ht 70.0 in | Wt 195.4 lb

## 2021-12-11 DIAGNOSIS — F5101 Primary insomnia: Secondary | ICD-10-CM

## 2021-12-11 DIAGNOSIS — E663 Overweight: Secondary | ICD-10-CM

## 2021-12-11 DIAGNOSIS — Z79899 Other long term (current) drug therapy: Secondary | ICD-10-CM

## 2021-12-11 DIAGNOSIS — Z7901 Long term (current) use of anticoagulants: Secondary | ICD-10-CM

## 2021-12-11 DIAGNOSIS — T753XXA Motion sickness, initial encounter: Secondary | ICD-10-CM

## 2021-12-11 DIAGNOSIS — M85832 Other specified disorders of bone density and structure, left forearm: Secondary | ICD-10-CM

## 2021-12-11 DIAGNOSIS — E782 Mixed hyperlipidemia: Secondary | ICD-10-CM

## 2021-12-11 DIAGNOSIS — I480 Paroxysmal atrial fibrillation: Secondary | ICD-10-CM

## 2021-12-11 DIAGNOSIS — I10 Essential (primary) hypertension: Secondary | ICD-10-CM

## 2021-12-11 MED ORDER — ONDANSETRON 4 MG PO TBDP: 4.0000 mg | ORAL_TABLET | Freq: Three times a day (TID) | ORAL | 0 refills | Status: DC | PRN

## 2021-12-11 MED ORDER — ZOLPIDEM TARTRATE 5 MG PO TABS: 5.0000 mg | ORAL_TABLET | Freq: Every evening | ORAL | 5 refills | Status: DC | PRN

## 2021-12-11 MED ORDER — MECLIZINE HCL 25 MG PO TABS: 12.5000 mg | ORAL_TABLET | Freq: Three times a day (TID) | ORAL | 0 refills | Status: DC | PRN

## 2021-12-11 NOTE — Patient Instructions (Signed)
You had labs performed today.  You will be contacted with the results of the labs once they are available, usually in the next 3 business days for routine lab work.  If you have an active my chart account, they will be released to your MyChart.  If you prefer to have these labs released to you via telephone, please let us know.     

## 2021-12-11 NOTE — Progress Notes (Signed)
? ?Subjective: ?CC: Hypertension, hyperlipidemia, A-fib ?PCP: Janora Norlander, DO ?Heather Keith is a 71 y.o. female presenting to clinic today for: ? ?1.  Hypertension with hyperlipidemia and atrial fibrillation ?Patient is compliant with her medications.  She is anticoagulated with Xarelto.  Denies any GI bleeding or complications with medication except for that it is expensive, costing $60 per month.  No reports of heart palpitations, shortness of breath or change in exercise tolerance.  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 ?2.  Insomnia ?Patient suffers from chronic insomnia and uses Ambien 5 mg nightly for sleep.  She is due for refills in May.  No reports of any intolerance or complications with Ambien ? ?3.  Seasickness/motion sickness ?Patient suffers from motion sickness even with car rides.  She will be going on an [place] cruise soon and is asking for meclizine and Zofran to have on hand. ? ? ?ROS: Per HPI ? ?Allergies  ?Allergen Reactions  ? Penicillins Rash  ? ?Past Medical History:  ?Diagnosis Date  ? HTN (hypertension)   ? (patient denies)  ? Neck pain   ? Paroxysmal A-fib ([place])   ? CHADS2vasc =1  ? PONV (postoperative nausea and vomiting)   ? and had a siezure once at dentist   ? Typical atrial flutter ([place])   ? Vasovagal syncope 04/28/13  ? ? ?Current Outpatient Medications:  ?  acetaminophen (TYLENOL) 500 MG tablet, Take 500 mg by mouth every 6 (six) hours as needed (pain). , Disp: , Rfl:  ?  albuterol (VENTOLIN HFA) 108 (90 Base) MCG/ACT inhaler, Inhale 2 puffs into the lungs every 6 (six) hours as needed for wheezing or shortness of breath., Disp: 8 g, Rfl: 0 ?  Coenzyme Q10 (CO Q-10) 400 MG CAPS, Take 1 tablet by mouth daily., Disp: , Rfl:  ?  escitalopram (LEXAPRO) 10 MG tablet, Take 1 tablet (10 mg total) by mouth daily., Disp: 90 tablet, Rfl: 3 ?  flecainide (TAMBOCOR) 100 MG tablet, Take 1 tablet (100 mg  total) by mouth 2 (two) times daily., Disp: 60 tablet, Rfl: 2 ?  fluticasone (FLONASE) 50 MCG/ACT nasal spray, Place 2 sprays into both nostrils daily., Disp: 16 g, Rfl: 6 ?  LORATADINE PO, Take 1 tablet by mouth daily., Disp: , Rfl:  ?  Multiple Vitamins-Minerals (MULTIVITAMIN WITH MINERALS) tablet, Take 1 tablet by mouth daily., Disp: , Rfl:  ?  Omega-3 1000 MG CAPS, Take by mouth., Disp: , Rfl:  ?  pravastatin (PRAVACHOL) 40 MG tablet, Take 1 tablet (40 mg total) by mouth daily., Disp: 90 tablet, Rfl: 3 ?  Probiotic Product (PROBIOTIC PO), Take 1 tablet by mouth daily., Disp: , Rfl:  ?  rivaroxaban (XARELTO) 20 MG TABS tablet, Take 1 tablet (20 mg total) by mouth daily with supper. Place on file, Disp: 90 tablet, Rfl: 3 ?  zolpidem (AMBIEN) 5 MG tablet, Take 1 tablet (5 mg total) by mouth at bedtime as needed for sleep., Disp: 30 tablet, Rfl: 5 ?Social History  ? ?Socioeconomic History  ? Marital status: Widowed  ?  Spouse name: Not on file  ? Number of children: Not on file  ? Years of education: Not on file  ? Highest education level: Not on file  ?Occupational History  ? Not on file  ?Tobacco Use  ? Smoking status: Never  ? Smokeless tobacco: Never  ?Vaping Use  ? Vaping Use:  Never used  ?Substance and Sexual Activity  ? Alcohol use: Yes  ?  Comment: occasionally  ? Drug use: No  ? Sexual activity: Not on file  ?Other Topics Concern  ? Not on file  ?Social History Narrative  ? Not on file  ? ?Social Determinants of Health  ? ?Financial Resource Strain: Not on file  ?Food Insecurity: Not on file  ?Transportation Needs: Not on file  ?Physical Activity: Not on file  ?Stress: Not on file  ?Social Connections: Not on file  ?Intimate Partner Violence: Not on file  ? ?Family History  ?Problem Relation Age of Onset  ? Hodgkin's lymphoma Mother   ? Heart attack Father 36  ? Diabetes Maternal Grandfather   ? ? ?Objective: ?Office vital signs reviewed. ?BP 118/67   Pulse 81   Temp (!) 97.5 ?F (36.4 ?C) (Temporal)   Ht  '5\' 10"'  (1.778 m)   Wt 195 lb 6 oz (88.6 kg)   SpO2 96%   BMI 28.03 kg/m?  ? ?Physical Examination:  ?General: Awake, alert, well nourished, No acute distress ?HEENT: Sclera white. ?Cardio: regular rate and rhythm, S1S2 heard, no murmurs appreciated ?Pulm: clear to auscultation bilaterally, no wheezes, rhonchi or rales; normal work of breathing on room air ?Psych: Mood stable, speech normal ? ?Depression screen [facility] 2/9 12/11/2021 06/13/2021 05/05/2021  ?Decreased Interest 1 0 0  ?Down, Depressed, Hopeless 0 0 0  ?PHQ - 2 Score 1 0 0  ?Altered sleeping 0 - -  ?Tired, decreased energy 1 - -  ?Change in appetite 0 - -  ?Feeling bad or failure about yourself  0 - -  ?Trouble concentrating 0 - -  ?Moving slowly or fidgety/restless 0 - -  ?Suicidal thoughts 0 - -  ?PHQ-9 Score 2 - -  ?Difficult doing work/chores Not difficult at all - -  ? ?GAD 7 : Generalized Anxiety Score 12/11/2021 06/13/2021 05/05/2021 01/16/2021  ?Nervous, Anxious, on Edge 0 0 0 0  ?Control/stop worrying 0 0 0 0  ?Worry too much - different things 0 0 0 0  ?Trouble relaxing 0 0 0 0  ?Restless 0 0 0 0  ?Easily annoyed or irritable 0 0 0 0  ?Afraid - awful might happen 0 0 0 0  ?Total GAD 7 Score 0 0 0 0  ?Anxiety Difficulty Not difficult at all Not difficult at all Not difficult at all Not difficult at all  ? ? ? ? ?Assessment/ Plan: ?71 y.o. female  ? ?Motion sickness, initial encounter - Plan: meclizine (ANTIVERT) 25 MG tablet, ondansetron (ZOFRAN-ODT) 4 MG disintegrating tablet ? ?Primary insomnia - Plan: zolpidem (AMBIEN) 5 MG tablet, ToxASSURE Select 13 (MW), Urine, Drug Screen 10 W/Conf, Se ? ?Controlled substance agreement signed - Plan: ToxASSURE Select 13 (MW), Urine, Drug Screen 10 W/Conf, Se ? ?Paroxysmal atrial fibrillation (HCC) - Plan: CMP14+EGFR, TSH ? ?Chronic anticoagulation - Plan: CBC ? ?Essential hypertension - Plan: CMP14+EGFR ? ?Mixed hyperlipidemia - Plan: CMP14+EGFR, Lipid Panel, TSH ? ?Osteopenia of left forearm - Plan: DG [facility]  DEXA ? ?Overweight (BMI 25.0-29.9) ? ?I have sent in meclizine and Zofran in anticipation of her [place] cruise soon.  She understands how to use medications appropriately ? ?Ambien renewed to place on hold.  She will be due for this medication again in May and this should help her get back out to every 15-monthcheckups.  The national narcotic database was reviewed and there were no red flags.  Controlled substance contract and drug  screen were updated as per office policy today. ? ?She seems to be both rate and rhythm controlled on exam today.  Check TSH, electrolytes and kidney function ? ?Check CBC given chronic anticoagulation with Xarelto.  Xarelto coupon provided today as well ? ?Blood pressure is controlled at home.  Continue current regimen.  Check liver enzymes, kidney function, fasting lipid.  She is interested in weight loss and I gave her handout about Wegovy.  She will inquire as to whether or not her insurance pays for that.  No apparent contraindications ? ?Plan for DEXA scan in April ? ?No orders of the defined types were placed in this encounter. ? ?No orders of the defined types were placed in this encounter. ? ? ? ?Janora Norlander, DO ?[facility] ?([phone] ? ? ?

## 2021-12-13 ENCOUNTER — Telehealth: Payer: Self-pay | Admitting: Family Medicine

## 2021-12-13 NOTE — Telephone Encounter (Signed)
Pt called to let Dr Nadine Counts know that her insurance will cover [facility] but only if she does a PA for it first. ? ?Also wants to know if Dr Nadine Counts pulled her a month supply of Wegovy samples as discussed at her visit? ? ?Please check and call patient. ?

## 2021-12-15 MED ORDER — WEGOVY 1.7 MG/0.75ML ~~LOC~~ SOAJ: 1.7000 mg | SUBCUTANEOUS | 0 refills | Status: DC

## 2021-12-15 MED ORDER — WEGOVY 0.25 MG/0.5ML ~~LOC~~ SOAJ: 0.2500 mg | SUBCUTANEOUS | 0 refills | Status: DC

## 2021-12-15 MED ORDER — WEGOVY 1 MG/0.5ML ~~LOC~~ SOAJ: 1.0000 mg | SUBCUTANEOUS | 0 refills | Status: DC

## 2021-12-15 MED ORDER — WEGOVY 0.5 MG/0.5ML ~~LOC~~ SOAJ: 0.5000 mg | SUBCUTANEOUS | 0 refills | Status: DC

## 2021-12-15 NOTE — Telephone Encounter (Signed)
Called her. done ?

## 2021-12-15 NOTE — Addendum Note (Signed)
Addended by: Raliegh Ip on: 12/15/2021 04:41 PM ? ? Modules accepted: Orders ? ?

## 2021-12-16 LAB — CMP14+EGFR
ALT: 31 IU/L (ref 0–32)
AST: 33 IU/L (ref 0–40)
Albumin/Globulin Ratio: 1.7 (ref 1.2–2.2)
Albumin: 4.2 g/dL (ref 3.7–4.7)
Alkaline Phosphatase: 83 IU/L (ref 44–121)
BUN/Creatinine Ratio: 26 (ref 12–28)
BUN: 23 mg/dL (ref 8–27)
Bilirubin Total: 0.2 mg/dL (ref 0.0–1.2)
CO2: 24 mmol/L (ref 20–29)
Calcium: 9.5 mg/dL (ref 8.7–10.3)
Chloride: 102 mmol/L (ref 96–106)
Creatinine, Ser: 0.88 mg/dL (ref 0.57–1.00)
Globulin, Total: 2.5 g/dL (ref 1.5–4.5)
Glucose: 104 mg/dL — ABNORMAL HIGH (ref 70–99)
Potassium: 4.8 mmol/L (ref 3.5–5.2)
Sodium: 139 mmol/L (ref 134–144)
Total Protein: 6.7 g/dL (ref 6.0–8.5)
eGFR: 70 mL/min/{1.73_m2} (ref >59)

## 2021-12-16 LAB — LIPID PANEL
Chol/HDL Ratio: 2.5 ratio (ref 0.0–4.4)
Cholesterol, Total: 194 mg/dL (ref 100–199)
HDL: 78 mg/dL (ref >39)
LDL Chol Calc (NIH): 104 mg/dL — ABNORMAL HIGH (ref 0–99)
Triglycerides: 65 mg/dL (ref 0–149)
VLDL Cholesterol Cal: 12 mg/dL (ref 5–40)

## 2021-12-16 LAB — CBC
Hematocrit: 41.6 % (ref 34.0–46.6)
Hemoglobin: 13.4 g/dL (ref 11.1–15.9)
MCH: 27.8 pg (ref 26.6–33.0)
MCHC: 32.2 g/dL (ref 31.5–35.7)
MCV: 86 fL (ref 79–97)
Platelets: 269 10*3/uL (ref 150–450)
RBC: 4.82 x10E6/uL (ref 3.77–5.28)
RDW: 13 % (ref 11.7–15.4)
WBC: 6.4 10*3/uL (ref 3.4–10.8)

## 2021-12-16 LAB — DRUG SCREEN 10 W/CONF, SERUM
Amphetamines, IA: NEGATIVE ng/mL
Barbiturates, IA: NEGATIVE ug/mL
Benzodiazepines, IA: NEGATIVE ng/mL
Cocaine & Metabolite, IA: NEGATIVE ng/mL
Methadone, IA: NEGATIVE ng/mL
Opiates, IA: NEGATIVE ng/mL
Oxycodones, IA: NEGATIVE ng/mL
Phencyclidine, IA: NEGATIVE ng/mL
Propoxyphene, IA: NEGATIVE ng/mL
THC(Marijuana) Metabolite, IA: NEGATIVE ng/mL

## 2021-12-16 LAB — TSH: TSH: 3.49 u[IU]/mL (ref 0.450–4.500)

## 2021-12-17 ENCOUNTER — Other Ambulatory Visit: Payer: Self-pay | Admitting: Family Medicine

## 2021-12-17 DIAGNOSIS — E782 Mixed hyperlipidemia: Secondary | ICD-10-CM

## 2021-12-17 MED ORDER — ROSUVASTATIN CALCIUM 20 MG PO TABS: 20.0000 mg | ORAL_TABLET | ORAL | 3 refills | Status: DC

## 2021-12-27 ENCOUNTER — Telehealth: Payer: Self-pay | Admitting: *Deleted

## 2021-12-27 NOTE — Telephone Encounter (Signed)
(  Key: [id number]) ?Rx #: [id number] ?TGYBWL 1.7MG /0.75ML auto-injectors ? ?  ?Form ?[employer] Electronic PA Form [phone] NCPDP) ? ?Message: The Prior Authorization request has been approved for Wegovy 1.7MG /0.75ML [place] SOAJ. The authorization is valid from 11/27/2021 through 06/25/2022. A letter of explanation will also be mailed to the patient. ?

## 2022-01-02 ENCOUNTER — Encounter: Payer: Self-pay | Admitting: Family Medicine

## 2022-01-04 ENCOUNTER — Encounter: Payer: Self-pay | Admitting: Family Medicine

## 2022-01-17 ENCOUNTER — Ambulatory Visit: Payer: Federal, State, Local not specified - PPO | Admitting: Family Medicine

## 2022-01-23 ENCOUNTER — Ambulatory Visit (INDEPENDENT_AMBULATORY_CARE_PROVIDER_SITE_OTHER): Payer: Federal, State, Local not specified - PPO

## 2022-01-23 DIAGNOSIS — M85832 Other specified disorders of bone density and structure, left forearm: Secondary | ICD-10-CM

## 2022-01-23 DIAGNOSIS — M8588 Other specified disorders of bone density and structure, other site: Secondary | ICD-10-CM

## 2022-01-24 ENCOUNTER — Other Ambulatory Visit: Payer: Self-pay | Admitting: Internal Medicine

## 2022-01-24 ENCOUNTER — Other Ambulatory Visit: Payer: Self-pay | Admitting: Family Medicine

## 2022-01-24 DIAGNOSIS — R4589 Other symptoms and signs involving emotional state: Secondary | ICD-10-CM

## 2022-02-19 ENCOUNTER — Ambulatory Visit: Payer: Federal, State, Local not specified - PPO | Admitting: Internal Medicine

## 2022-02-19 ENCOUNTER — Encounter: Payer: Self-pay | Admitting: Internal Medicine

## 2022-02-19 VITALS — BP 104/66 | HR 78 | Ht 70.0 in | Wt 194.0 lb

## 2022-02-19 DIAGNOSIS — I480 Paroxysmal atrial fibrillation: Secondary | ICD-10-CM | POA: Diagnosis not present

## 2022-02-19 DIAGNOSIS — I10 Essential (primary) hypertension: Secondary | ICD-10-CM | POA: Diagnosis not present

## 2022-02-19 NOTE — Progress Notes (Signed)
HPI Ms. Heather Keith returns today for followup. She is a pleasant 70 yo woman with a h/o HTN, PAF, and obesity. She has had minimal arrhythmia since her last visit. She has had a DVT and has been on xarelto. She has minimal palpitations and has been on flecainide. She notes mild orthostasis.  Allergies  Allergen Reactions   Penicillins Rash     Current Outpatient Medications  Medication Sig Dispense Refill   acetaminophen (TYLENOL) 500 MG tablet Take 500 mg by mouth every 6 (six) hours as needed (pain).      albuterol (VENTOLIN HFA) 108 (90 Base) MCG/ACT inhaler Inhale 2 puffs into the lungs every 6 (six) hours as needed for wheezing or shortness of breath. 8 g 0   Coenzyme Q10 (CO Q-10) 400 MG CAPS Take 1 tablet by mouth daily.     escitalopram (LEXAPRO) 10 MG tablet TAKE 1 TABLET BY MOUTH EVERY DAY 90 tablet 1   flecainide (TAMBOCOR) 100 MG tablet Take 1 tablet (100 mg total) by mouth 2 (two) times daily. Keep upcoming appt for future refills. 180 tablet 0   fluticasone (FLONASE) 50 MCG/ACT nasal spray Place 2 sprays into both nostrils daily. 16 g 6   LORATADINE PO Take 1 tablet by mouth daily.     meclizine (ANTIVERT) 25 MG tablet Take 0.5-1 tablets (12.5-25 mg total) by mouth 3 (three) times daily as needed for dizziness. 30 tablet 0   Multiple Vitamins-Minerals (MULTIVITAMIN WITH MINERALS) tablet Take 1 tablet by mouth daily.     Omega-3 1000 MG CAPS Take by mouth.     ondansetron (ZOFRAN-ODT) 4 MG disintegrating tablet Take 1 tablet (4 mg total) by mouth every 8 (eight) hours as needed for nausea or vomiting. 20 tablet 0   Probiotic Product (PROBIOTIC PO) Take 1 tablet by mouth daily.     rivaroxaban (XARELTO) 20 MG TABS tablet Take 1 tablet (20 mg total) by mouth daily with supper. Place on file 90 tablet 3   rosuvastatin (CRESTOR) 20 MG tablet Take 1 tablet (20 mg total) by mouth every other day. To replace pravastatin 45 tablet 3   Semaglutide-Weight Management (WEGOVY) 0.25  MG/0.5ML SOAJ Inject 0.25 mg into the skin every 7 (seven) days. 2 mL 0   Semaglutide-Weight Management (WEGOVY) 0.5 MG/0.5ML SOAJ Inject 0.5 mg into the skin every 7 (seven) days. Month #2 2 mL 0   Semaglutide-Weight Management (WEGOVY) 1 MG/0.5ML SOAJ Inject 1 mg into the skin every 7 (seven) days. Month #3 2 mL 0   Semaglutide-Weight Management (WEGOVY) 1.7 MG/0.75ML SOAJ Inject 1.7 mg into the skin every 7 (seven) days. Month#4 3 mL 0   zolpidem (AMBIEN) 5 MG tablet Take 1 tablet (5 mg total) by mouth at bedtime as needed for sleep. Put on file 30 tablet 5   No current facility-administered medications for this visit.     Past Medical History:  Diagnosis Date   HTN (hypertension)    (patient denies)   Neck pain    Paroxysmal A-fib (HCC)    CHADS2vasc =1   PONV (postoperative nausea and vomiting)    and had a siezure once at dentist    Typical atrial flutter (HCC)    Vasovagal syncope 04/28/13    ROS:   All systems reviewed and negative except as noted in the HPI.   Past Surgical History:  Procedure Laterality Date   CARDIOVERSION N/A 04/22/2015   Procedure: CARDIOVERSION;  Surgeon: Quintella Reichert, MD;  Location: [facility] ENDOSCOPY;  Service: Cardiovascular;  Laterality: N/A;   KNEE SURGERY     TEE WITHOUT CARDIOVERSION N/A 04/22/2015   Procedure: TRANSESOPHAGEAL ECHOCARDIOGRAM (TEE);  Surgeon: Quintella Reichert, MD;  [facility] END[facility]COPY;  Service: Cardiovascular;  Laterality: N/A;     Family History  Problem Relation Age of Onset   Hodgkin's lymphoma Mother    Heart attack Father 54   Diabetes Maternal Grandfather      Social History   Socioeconomic History   Marital status: Widowed    Spouse name: Not on file   Number of children: Not on file   Years of education: Not on file   Highest education level: Not on file  Occupational History   Not on file  Tobacco Use   Smoking status: Never   Smokeless tobacco: Never  Vaping Use   Vaping Use: Never used  Substance  and Sexual Activity   Alcohol use: Yes    Comment: occasionally   Drug use: No   Sexual activity: Not on file  Other Topics Concern   Not on file  Social History Narrative   Not on file   Social Determinants of Health   Financial Resource Strain: Not on file  Food Insecurity: Not on file  Transportation Needs: Not on file  Physical Activity: Not on file  Stress: Not on file  Social Connections: Not on file  Intimate Partner Violence: Not on file     BP 104/66   Pulse 78   Ht 5\' 10"  (1.778 m)   Wt 194 lb (88 kg)   SpO2 96%   BMI 27.84 kg/m   Physical Exam:  Well appearing NAD HEENT: Unremarkable Neck:  No JVD, no thyromegally Lymphatics:  No adenopathy Back:  No CVA tenderness Lungs:  Clear with no wheezes HEART:  Regular rate rhythm, no murmurs, no rubs, no clicks Abd:  soft, positive bowel sounds, no organomegally, no rebound, no guarding Ext:  2 plus pulses, no edema, no cyanosis, no clubbing Skin:  No rashes no nodules Neuro:  CN II through XII intact, motor grossly intact  EKG - nsr   Assess/Plan:  1. PAF - she is maintaining NSR very nicely. She will continue flecainide but she is off of her beta blocker. 2. Obesity - she has lost 10 lbs. On Wegovy 3. Hypotension - her bp is well controlled. No change in meds. I encouraged her to eat more salt if she has orthostasis. 4. Coags -she has not had any bleeding on xarelto.   Rontae Inglett,MD

## 2022-02-19 NOTE — Patient Instructions (Signed)
Medication Instructions:  Your physician recommends that you continue on your current medications as directed. Please refer to the Current Medication list given to you today. *If you need a refill on your cardiac medications before your next appointment, please call your pharmacy*  Lab Work: None. If you have labs (blood work) drawn today and your tests are completely normal, you will receive your results only by: MyChart Message (if you have MyChart) OR A paper copy in the mail If you have any lab test that is abnormal or we need to change your treatment, we will call you to review the results.  Testing/Procedures: None.  Follow-Up: At [facility], you and your health needs are our priority.  As part of our continuing mission to provide you with exceptional heart care, we have created designated Provider Care Teams.  These Care Teams include your primary Cardiologist (physician) and Advanced Practice Providers (APPs -  Physician Assistants and Nurse Practitioners) who all work together to provide you with the care you need, when you need it.  Your physician wants you to follow-up in: 6 months with Lewayne Bunting, MD or one of the following Advanced Practice Providers on your designated Care Team:    Francis Dowse, [place] Casimiro Needle "Mardelle Matte" [place], [place]   You will receive a reminder letter in the mail two months in advance. If you don't receive a letter, please call our office to schedule the follow-up appointment.  We recommend signing up for the patient portal called "MyChart".  Sign up information is provided on this After Visit Summary.  MyChart is used to connect with patients for Virtual Visits (Telemedicine).  Patients are able to view lab/test results, encounter notes, upcoming appointments, etc.  Non-urgent messages can be sent to your provider as well.   To learn more about what you can do with MyChart, go to [email or web address].    Any Other Special Instructions Will Be Listed  Below (If Applicable).

## 2022-02-21 ENCOUNTER — Other Ambulatory Visit: Payer: Self-pay | Admitting: Family Medicine

## 2022-02-21 DIAGNOSIS — E663 Overweight: Secondary | ICD-10-CM

## 2022-02-21 DIAGNOSIS — E782 Mixed hyperlipidemia: Secondary | ICD-10-CM

## 2022-02-21 DIAGNOSIS — I10 Essential (primary) hypertension: Secondary | ICD-10-CM

## 2022-03-20 ENCOUNTER — Ambulatory Visit: Payer: Federal, State, Local not specified - PPO | Admitting: Physician Assistant

## 2022-03-20 ENCOUNTER — Encounter: Payer: Self-pay | Admitting: Physician Assistant

## 2022-03-20 VITALS — BP 98/62 | HR 77 | Temp 97.1°F | Ht 70.0 in | Wt 190.0 lb

## 2022-03-20 DIAGNOSIS — R5383 Other fatigue: Secondary | ICD-10-CM

## 2022-03-20 NOTE — Patient Instructions (Signed)
Fatigue If you have fatigue, you feel tired all the time and have a lack of energy or a lack of motivation. Fatigue may make it difficult to start or complete tasks because of exhaustion. Occasional or mild fatigue is often a normal response to activity or life. However, long-term (chronic) or extreme fatigue may be a symptom of a medical condition such as: Depression. Not having enough red blood cells or hemoglobin in the blood (anemia). A problem with a small gland located in the lower front part of the neck (thyroid disorder). Rheumatologic conditions. These are problems related to the body's defense system (immune system). Infections, especially certain viral infections. Fatigue can also lead to negative health outcomes over time. Follow these instructions at home: Medicines Take over-the-counter and prescription medicines only as told by your health care provider. Take a multivitamin if told by your health care provider. Do not use herbal or dietary supplements unless they are approved by your health care provider. Eating and drinking  Avoid heavy meals in the evening. Eat a well-balanced diet, which includes lean proteins, whole grains, plenty of fruits and vegetables, and low-fat dairy products. Avoid eating or drinking too many products with caffeine in them. Avoid alcohol. Drink enough fluid to keep your urine pale yellow. Activity  Exercise regularly, as told by your health care provider. Use or practice techniques to help you relax, such as yoga, tai chi, meditation, or massage therapy. Lifestyle Change situations that cause you stress. Try to keep your work and personal schedules in balance. Do not use recreational or illegal drugs. General instructions Monitor your fatigue for any changes. Go to bed and get up at the same time every day. Avoid fatigue by pacing yourself during the day and getting enough sleep at night. Maintain a healthy weight. Contact a health care  provider if: Your fatigue does not get better. You have a fever. You suddenly lose or gain weight. You have headaches. You have trouble falling asleep or sleeping through the night. You feel angry, guilty, anxious, or sad. You have swelling in your legs or another part of your body. Get help right away if: You feel confused, feel like you might faint, or faint. Your vision is blurry or you have a severe headache. You have severe pain in your abdomen, your back, or the area between your waist and hips (pelvis). You have chest pain, shortness of breath, or an irregular or fast heartbeat. You are unable to urinate, or you urinate less than normal. You have abnormal bleeding from the rectum, nose, lungs, nipples, or, if you are female, the vagina. You vomit blood. You have thoughts about hurting yourself or others. These symptoms may be an emergency. Get help right away. Call 911. Do not wait to see if the symptoms will go away. Do not drive yourself to the hospital. Get help right away if you feel like you may hurt yourself or others, or have thoughts about taking your own life. Go to your nearest emergency room or: Call 911. Call the [facility] at [phone] or 988. This is open 24 hours a day. Text the Crisis Text Line at 979-823-0385. Summary If you have fatigue, you feel tired all the time and have a lack of energy or a lack of motivation. Fatigue may make it difficult to start or complete tasks because of exhaustion. Long-term (chronic) or extreme fatigue may be a symptom of a medical condition. Exercise regularly, as told by your health care provider.  Change situations that cause you stress. Try to keep your work and personal schedules in balance. This information is not intended to replace advice given to you by your health care provider. Make sure you discuss any questions you have with your health care provider. Document Revised: 07/10/2021 Document  Reviewed: 07/10/2021 Elsevier Patient Education  2023 Elsevier Inc.  

## 2022-03-20 NOTE — Progress Notes (Signed)
  Subjective:     Patient ID: Heather Keith, female   DOB: [birth year], 71 y.o.   MRN: [id number]  Dizziness Associated symptoms include fatigue. Pertinent negatives include no headaches or weakness.   Pt here due to intermit dizzy spells and feeling lightheaded Sx are intermit and resolve with her sitting No meds for sx Sig PMH of Afib and recently seen by her Card There discussed low BP and all BP meds stop at that visit They recommended adding salt back to her diet Pt states this does not feel like she did when she had Afib  Review of Systems  Constitutional:  Positive for fatigue. Negative for unexpected weight change.  Respiratory: Negative.    Cardiovascular: Negative.   Neurological:  Positive for dizziness and light-headedness. Negative for facial asymmetry, speech difficulty, weakness and headaches.       Objective:   Physical Exam Vitals and nursing note reviewed.  Constitutional:      General: She is not in acute distress.    Appearance: Normal appearance. She is not ill-appearing or toxic-appearing.  Neck:     Vascular: No carotid bruit.  Cardiovascular:     Rate and Rhythm: Normal rate and regular rhythm.     Pulses: Normal pulses.     Heart sounds: Normal heart sounds.  Pulmonary:     Effort: Pulmonary effort is normal.     Breath sounds: Normal breath sounds.  Musculoskeletal:     Cervical back: Neck supple.  Neurological:     Mental Status: She is alert.   CBC pending     Assessment:     1. Fatigue, unspecified type        Plan:     Reviewed labs from march and reviewed with pt She would like to repeat the CB so ordered today Proceed with increasing salt back to her diet as recommended by her Cardiologist Also discussed if labs normal may want to get back with cardiologist for possible event monitor F/U pending labs PATP

## 2022-03-21 LAB — CBC WITH DIFFERENTIAL/PLATELET
Basophils Absolute: 0.1 10*3/uL (ref 0.0–0.2)
Basos: 1 %
EOS (ABSOLUTE): 0.1 10*3/uL (ref 0.0–0.4)
Eos: 2 %
Hematocrit: 35.9 % (ref 34.0–46.6)
Hemoglobin: 11.4 g/dL (ref 11.1–15.9)
Immature Grans (Abs): 0 10*3/uL (ref 0.0–0.1)
Immature Granulocytes: 0 %
Lymphocytes Absolute: 2.4 10*3/uL (ref 0.7–3.1)
Lymphs: 31 %
MCH: 26.7 pg (ref 26.6–33.0)
MCHC: 31.8 g/dL (ref 31.5–35.7)
MCV: 84 fL (ref 79–97)
Monocytes Absolute: 0.7 10*3/uL (ref 0.1–0.9)
Monocytes: 9 %
Neutrophils Absolute: 4.3 10*3/uL (ref 1.4–7.0)
Neutrophils: 57 %
Platelets: 304 10*3/uL (ref 150–450)
RBC: 4.27 x10E6/uL (ref 3.77–5.28)
RDW: 13.6 % (ref 11.7–15.4)
WBC: 7.7 10*3/uL (ref 3.4–10.8)

## 2022-03-23 ENCOUNTER — Other Ambulatory Visit: Payer: Self-pay | Admitting: Family Medicine

## 2022-03-23 DIAGNOSIS — Z1231 Encounter for screening mammogram for malignant neoplasm of breast: Secondary | ICD-10-CM

## 2022-03-26 ENCOUNTER — Ambulatory Visit
Admission: RE | Admit: 2022-03-26 | Discharge: 2022-03-26 | Disposition: A | Discharge status: Home / Self Care | Payer: Federal, State, Local not specified - PPO | Source: Ambulatory Visit | Attending: Family Medicine | Admitting: Family Medicine

## 2022-03-26 DIAGNOSIS — Z1231 Encounter for screening mammogram for malignant neoplasm of breast: Secondary | ICD-10-CM

## 2022-04-04 ENCOUNTER — Telehealth: Payer: Self-pay | Admitting: Internal Medicine

## 2022-04-04 NOTE — Telephone Encounter (Signed)
Pt c/o Shortness Of Breath: STAT if SOB developed within the last 24 hours or pt is noticeably SOB on the phone  1. Are you currently SOB (can you hear that pt is SOB on the phone)? no  2. How long have you been experiencing SOB? Few weeks, getting worse in the last few days  3. Are you SOB when sitting or when up moving around? Moving around  4. Are you currently experiencing any other symptoms? Neck and shoulders get tense after walking dogs  Patient states she has been having SOB for a few weeks, but in the last few days it has gotten worse.

## 2022-04-04 NOTE — Progress Notes (Unsigned)
Cardiology Office Note   Date:  04/04/2022   ID:  Heather, Keith 1951/06/15, MRN [id number]  PCP:  Raliegh Ip, DO    No chief complaint on file.  fatigue  Wt Readings from Last 3 Encounters:  03/20/22 190 lb (86.2 kg)  02/19/22 194 lb (88 kg)  12/11/21 195 lb 6 oz (88.6 kg)       History of Present Illness: Heather Keith is a 71 y.o. female  seen by Dr. Ladona Ridgel,  with a h/o HTN, PAF, and obesity.  In 5/23: " She has had a DVT and has been on xarelto. She has minimal palpitations and has been on flecainide. She notes mild orthostasis. "  Called to office: "c/o dyspnea on exertion, worsening SOB, dizziness, and feeling of being lightheaded.    When called, PT stated same symptoms ongoing for 2 weeks, and worsening.  She gets short of breath while ambulating, and feels extremely light headed and dizzy when walking her dogs. She stated she has to stop walking and bend over / rest to return home.  Pt also c/o pressure in her neck and shoulders.   Pt PMH PAF, Hypotension, Coags, and saw Dr Ladona Ridgel on 02/19/22.    Pt saw Mr. Leticia Penna PA-C for fatigue, but states symptoms worsening since this visit.     Pt added to Dr. Eldridge Dace DOD schedule for tomorrow, April 05, 2022 at 300 pm to see a provider for her worsening SOB and dizziness symptoms. "  Past Medical History:  Diagnosis Date   HTN (hypertension)    (patient denies)   Neck pain    Paroxysmal A-fib (HCC)    CHADS2vasc =1   PONV (postoperative nausea and vomiting)    and had a siezure once at dentist    Typical atrial flutter (HCC)    Vasovagal syncope 04/28/13    Past Surgical History:  Procedure Laterality Date   CARDIOVERSION N/A 04/22/2015   Procedure: CARDIOVERSION;  Surgeon: Quintella Reichert, MD;  Location: [facility] ENDOSCOPY;  Service: Cardiovascular;  Laterality: N/A;   KNEE SURGERY     TEE WITHOUT CARDIOVERSION N/A 04/22/2015   Procedure: TRANSESOPHAGEAL ECHOCARDIOGRAM (TEE);  Surgeon: Quintella Reichert, MD;  Location: [facility] ENDOSCOPY;  Service: Cardiovascular;  Laterality: N/A;     Current Outpatient Medications  Medication Sig Dispense Refill   acetaminophen (TYLENOL) 500 MG tablet Take 500 mg by mouth every 6 (six) hours as needed (pain).      albuterol (VENTOLIN HFA) 108 (90 Base) MCG/ACT inhaler Inhale 2 puffs into the lungs every 6 (six) hours as needed for wheezing or shortness of breath. 8 g 0   Coenzyme Q10 (CO Q-10) 400 MG CAPS Take 1 tablet by mouth daily.     escitalopram (LEXAPRO) 10 MG tablet TAKE 1 TABLET BY MOUTH EVERY DAY 90 tablet 1   flecainide (TAMBOCOR) 100 MG tablet Take 1 tablet (100 mg total) by mouth 2 (two) times daily. Keep upcoming appt for future refills. 180 tablet 0   fluticasone (FLONASE) 50 MCG/ACT nasal spray Place 2 sprays into both nostrils daily. 16 g 6   LORATADINE PO Take 1 tablet by mouth daily.     meclizine (ANTIVERT) 25 MG tablet Take 0.5-1 tablets (12.5-25 mg total) by mouth 3 (three) times daily as needed for dizziness. 30 tablet 0   Multiple Vitamins-Minerals (MULTIVITAMIN WITH MINERALS) tablet Take 1 tablet by mouth daily.     Omega-3 1000 MG CAPS Take by  mouth.     ondansetron (ZOFRAN-ODT) 4 MG disintegrating tablet Take 1 tablet (4 mg total) by mouth every 8 (eight) hours as needed for nausea or vomiting. 20 tablet 0   Probiotic Product (PROBIOTIC PO) Take 1 tablet by mouth daily.     rivaroxaban (XARELTO) 20 MG TABS tablet Take 1 tablet (20 mg total) by mouth daily with supper. Place on file 90 tablet 3   rosuvastatin (CRESTOR) 20 MG tablet Take 1 tablet (20 mg total) by mouth every other day. To replace pravastatin 45 tablet 3   Semaglutide-Weight Management (WEGOVY) 0.25 MG/0.5ML SOAJ Inject 0.25 mg into the skin every 7 (seven) days. 2 mL 0   Semaglutide-Weight Management (WEGOVY) 0.5 MG/0.5ML SOAJ Inject 0.5 mg into the skin every 7 (seven) days. Month #2 2 mL 0   Semaglutide-Weight Management (WEGOVY) 1 MG/0.5ML SOAJ Inject 1 mg into  the skin every 7 (seven) days. Month #3 2 mL 0   Semaglutide-Weight Management (WEGOVY) 1.7 MG/0.75ML SOAJ Inject 1.7 mg into the skin every 7 (seven) days. Month#4 3 mL 0   zolpidem (AMBIEN) 5 MG tablet Take 1 tablet (5 mg total) by mouth at bedtime as needed for sleep. Put on file 30 tablet 5   No current facility-administered medications for this visit.    Allergies:   Penicillins    Social History:  The patient  reports that she has never smoked. She has never used smokeless tobacco. She reports current alcohol use. She reports that she does not use drugs.   Family History:  The patient's ***family history includes Breast cancer in her paternal aunt; Diabetes in her maternal grandfather; Heart attack (age of onset: 34) in her father; Hodgkin's lymphoma in her mother.    ROS:  Please see the history of present illness.   Otherwise, review of systems are positive for ***.   All other systems are reviewed and negative.    PHYSICAL EXAM: VS:  There were no vitals taken for this visit. , BMI There is no height or weight on file to calculate BMI. GEN: Well nourished, well developed, in no acute distress HEENT: normal Neck: no JVD, carotid bruits, or masses Cardiac: ***RRR; no murmurs, rubs, or gallops,no edema  Respiratory:  clear to auscultation bilaterally, normal work of breathing GI: soft, nontender, nondistended, + BS MS: no deformity or atrophy Skin: warm and dry, no rash Neuro:  Strength and sensation are intact Psych: euthymic mood, full affect   EKG:   The ekg ordered today demonstrates ***   Recent Labs: 04/12/2021: Magnesium 1.9 12/11/2021: ALT 31; BUN 23; Creatinine, Ser 0.88; Potassium 4.8; Sodium 139; TSH 3.490 03/20/2022: Hemoglobin 11.4; Platelets 304   Lipid Panel    Component Value Date/Time   CHOL 194 12/11/2021 0955   TRIG 65 12/11/2021 0955   HDL 78 12/11/2021 0955   CHOLHDL 2.5 12/11/2021 0955   LDLCALC 104 (H) 12/11/2021 0955     Other studies  Reviewed: Additional studies/ records that were reviewed today with results demonstrating: ***.   ASSESSMENT AND PLAN:  PAF:  Hypotension: Anticoagulated:   Current medicines are reviewed at length with the patient today.  The patient concerns regarding her medicines were addressed.  The following changes have been made:  No change***  Labs/ tests ordered today include: *** No orders of the defined types were placed in this encounter.   Recommend 150 minutes/week of aerobic exercise Low fat, low carb, high fiber diet recommended  Disposition:   FU in ***  Signed, Lance Muss, MD  04/04/2022 5:42 PM    [facility] [address] [place], [place], [place]  [address] Phone: [phone]; Fax: [phone]

## 2022-04-04 NOTE — Telephone Encounter (Signed)
Pt contacted [facility] c/o dyspnea on exertion, worsening SOB, dizziness, and feeling of being lightheaded.   When called, PT stated same symptoms ongoing for 2 weeks, and worsening.  She gets short of breath while ambulating, and feels extremely light headed and dizzy when walking her dogs. She stated she has to stop walking and bend over / rest to return home.  Pt also c/o pressure in her neck and shoulders.   Pt PMH PAF, Hypotension, Coags, and saw Dr Ladona Ridgel on 02/19/22.   Pt saw Mr. Leticia Penna PA-C for fatigue, but states symptoms worsening since this visit.    Pt added to Dr. Eldridge Dace DOD schedule for tomorrow, April 05, 2022 at 300 pm to see a provider for her worsening SOB and dizziness symptoms.   Pt advised to go to ER if her symptoms worsen this afternoon or overnight.  Pt agreed and understood instructions.

## 2022-04-05 ENCOUNTER — Ambulatory Visit: Payer: Federal, State, Local not specified - PPO | Admitting: Interventional Cardiology

## 2022-04-05 ENCOUNTER — Encounter: Payer: Self-pay | Admitting: Interventional Cardiology

## 2022-04-05 VITALS — BP 100/70 | HR 72 | Ht 70.0 in | Wt 189.0 lb

## 2022-04-05 DIAGNOSIS — I10 Essential (primary) hypertension: Secondary | ICD-10-CM

## 2022-04-05 DIAGNOSIS — R0609 Other forms of dyspnea: Secondary | ICD-10-CM | POA: Diagnosis not present

## 2022-04-05 DIAGNOSIS — I480 Paroxysmal atrial fibrillation: Secondary | ICD-10-CM | POA: Diagnosis not present

## 2022-04-05 NOTE — Patient Instructions (Addendum)
Medication Instructions:  Your physician recommends that you continue on your current medications as directed. Please refer to the Current Medication list given to you today.  *If you need a refill on your cardiac medications before your next appointment, please call your pharmacy*  Lab Work: Your physician recommends that you have lab work today- BMET and BNP If you have labs (blood work) drawn today and your tests are completely normal, you will receive your results only by: MyChart Message (if you have MyChart) OR A paper copy in the mail If you have any lab test that is abnormal or we need to change your treatment, we will call you to review the results.  Testing/Procedures: Your physician has requested that you have an echocardiogram. Echocardiography is a painless test that uses sound waves to create images of your heart. It provides your doctor with information about the size and shape of your heart and how well your heart's chambers and valves are working. This procedure takes approximately one hour. There are no restrictions for this procedure.  Follow-Up: At [facility], you and your health needs are our priority.  As part of our continuing mission to provide you with exceptional heart care, we have created designated Provider Care Teams.  These Care Teams include your primary Cardiologist (physician) and Advanced Practice Providers (APPs -  Physician Assistants and Nurse Practitioners) who all work together to provide you with the care you need, when you need it.  We recommend signing up for the patient portal called "MyChart".  Sign up information is provided on this After Visit Summary.  MyChart is used to connect with patients for Virtual Visits (Telemedicine).  Patients are able to view lab/test results, encounter notes, upcoming appointments, etc.  Non-urgent messages can be sent to your provider as well.   To learn more about what you can do with MyChart, go to  [email or web address].    Your next appointment:   As scheduled  The format for your next appointment:   In Person  Provider:   Dr. Ladona Ridgel   Important Information About Sugar

## 2022-04-06 ENCOUNTER — Other Ambulatory Visit: Payer: Self-pay | Admitting: Interventional Cardiology

## 2022-04-06 DIAGNOSIS — R0609 Other forms of dyspnea: Secondary | ICD-10-CM

## 2022-04-06 DIAGNOSIS — I480 Paroxysmal atrial fibrillation: Secondary | ICD-10-CM

## 2022-04-06 LAB — BASIC METABOLIC PANEL
BUN/Creatinine Ratio: 13 (ref 12–28)
BUN: 11 mg/dL (ref 8–27)
CO2: 22 mmol/L (ref 20–29)
Calcium: 9.8 mg/dL (ref 8.7–10.3)
Chloride: 102 mmol/L (ref 96–106)
Creatinine, Ser: 0.83 mg/dL (ref 0.57–1.00)
Glucose: 87 mg/dL (ref 70–99)
Potassium: 4.7 mmol/L (ref 3.5–5.2)
Sodium: 139 mmol/L (ref 134–144)
eGFR: 75 mL/min/{1.73_m2} (ref >59)

## 2022-04-06 LAB — PRO B NATRIURETIC PEPTIDE: NT-Pro BNP: 276 pg/mL (ref 0–301)

## 2022-04-10 ENCOUNTER — Ambulatory Visit (INDEPENDENT_AMBULATORY_CARE_PROVIDER_SITE_OTHER): Payer: Federal, State, Local not specified - PPO

## 2022-04-10 DIAGNOSIS — I480 Paroxysmal atrial fibrillation: Secondary | ICD-10-CM

## 2022-04-10 DIAGNOSIS — R0609 Other forms of dyspnea: Secondary | ICD-10-CM | POA: Diagnosis not present

## 2022-04-10 LAB — ECHOCARDIOGRAM COMPLETE
AR max vel: 2.44 cm2
AV Area VTI: 2.81 cm2
AV Area mean vel: 2.81 cm2
AV Mean grad: 3.6 mmHg
AV Peak grad: 7.5 mmHg
Ao pk vel: 1.37 m/s
Area-P 1/2: 3.12 cm2
Calc EF: 68.6 %
S' Lateral: 2.35 cm
Single Plane A2C EF: 70.4 %
Single Plane A4C EF: 68 %

## 2022-04-17 IMAGING — CT CT CHEST W/O CM
2 of 4 series · 15 of 36 positions shown, 18 images · non-contrast
Comparison: Chest CT dated 08/07/2015.

CLINICAL DATA: 69-year-old female with lung nodule.

EXAM:
CT CHEST WITHOUT CONTRAST
TECHNIQUE: Multidetector CT imaging of the chest was performed following the
standard protocol without IV contrast.

[Series 2: routine chest without · axial · non-contrast · 0.62mm/px · z∈[-435,-171]mm · 12 of 157 slices shown, 15 images]
[im 13/157  mediastinal]
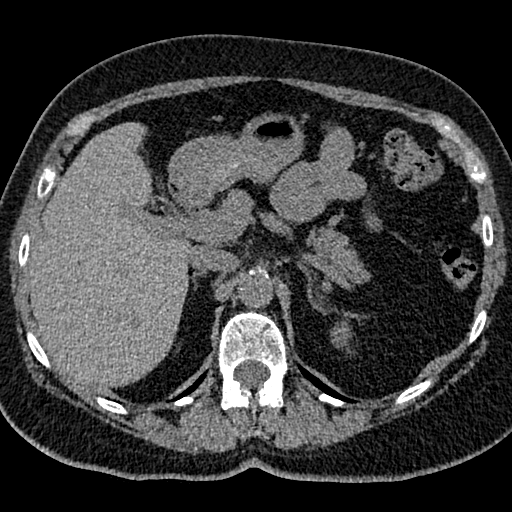
[im 13/157  lung]
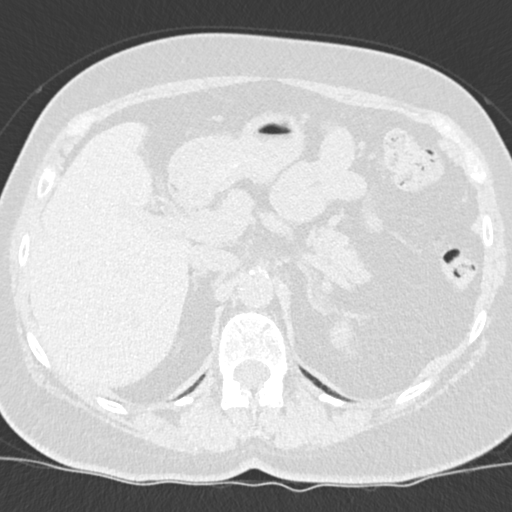
[im 25/157  lung]
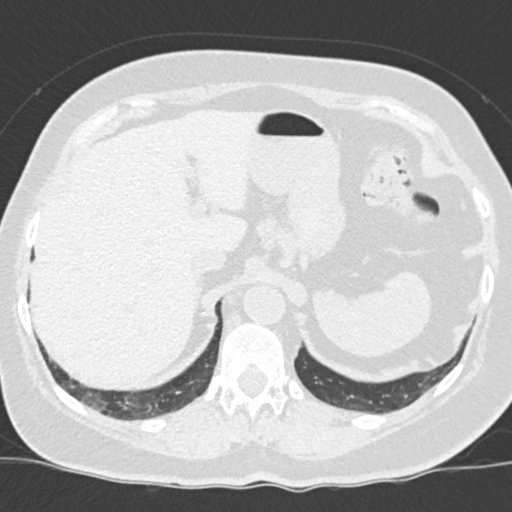
[im 37/157  lung]
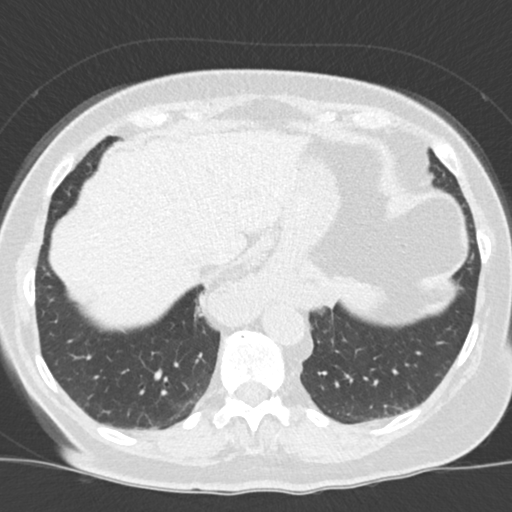
[im 49/157  lung]
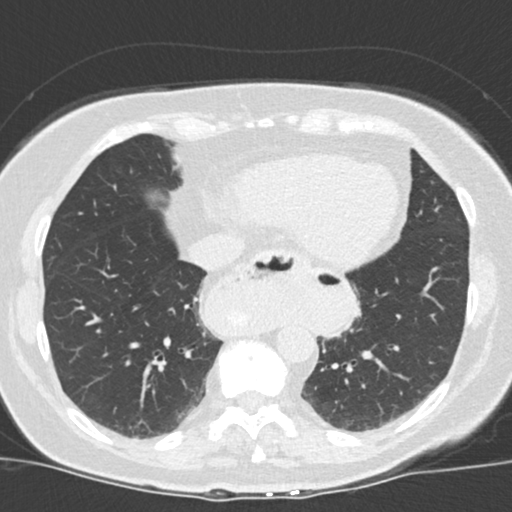
[im 61/157  mediastinal]
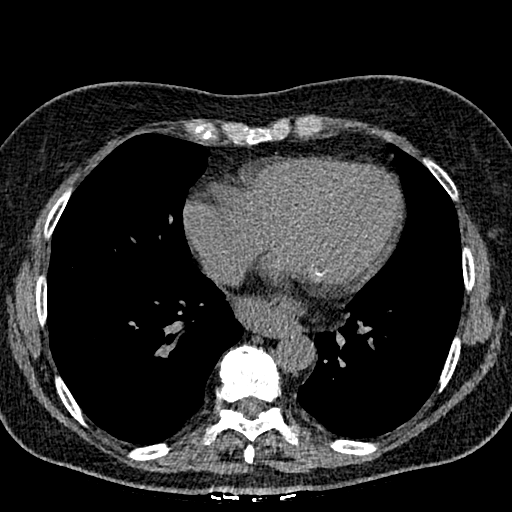
[im 61/157  lung]
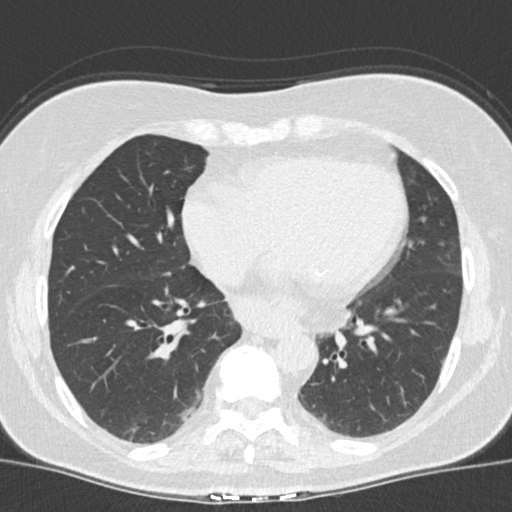
[im 73/157  lung]
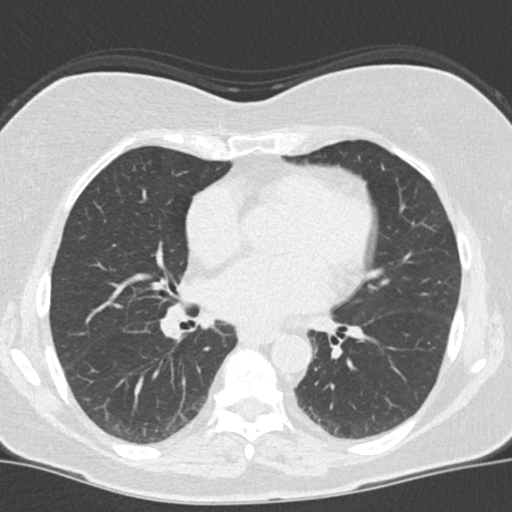
[im 85/157  lung]
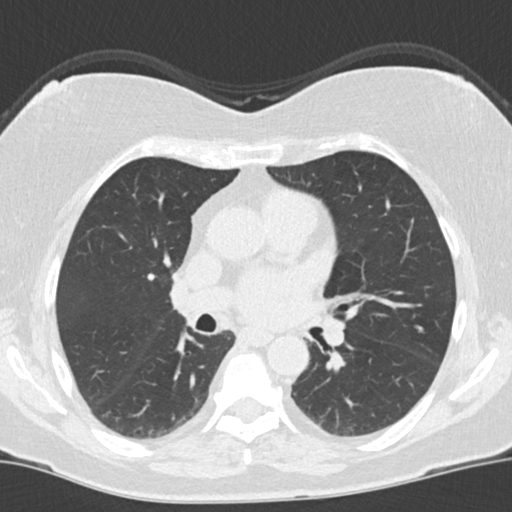
[im 97/157  lung]
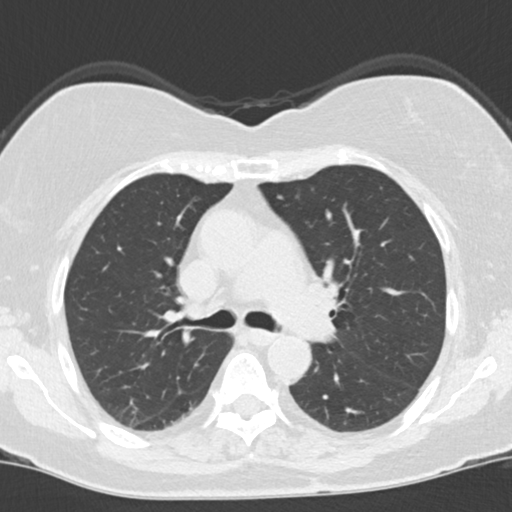
[im 109/157  mediastinal]
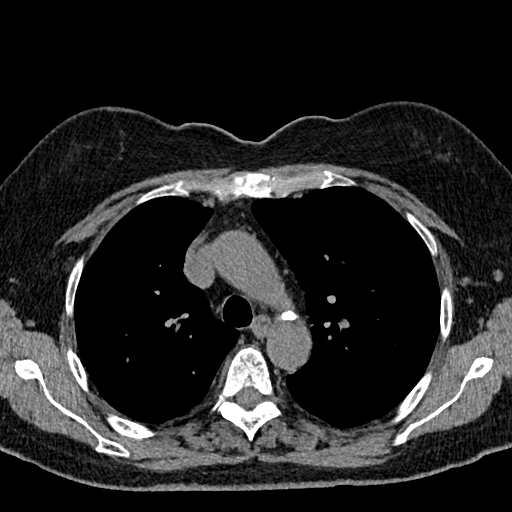
[im 109/157  lung]
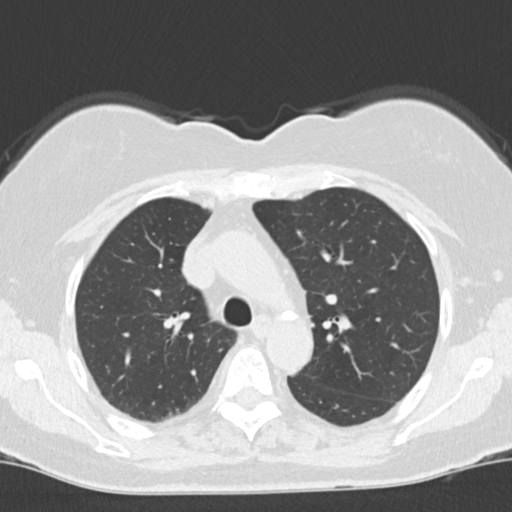
[im 121/157  lung]
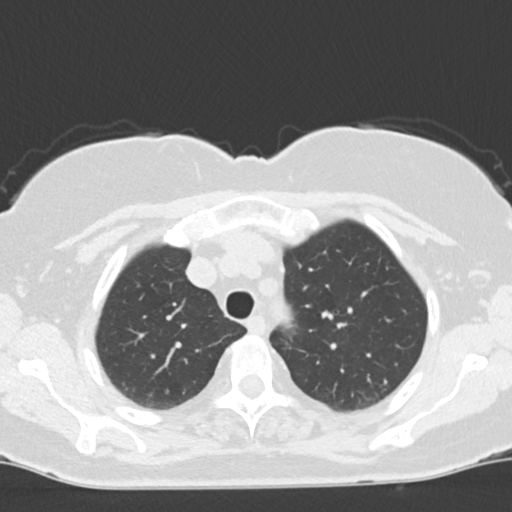
[im 133/157  lung]
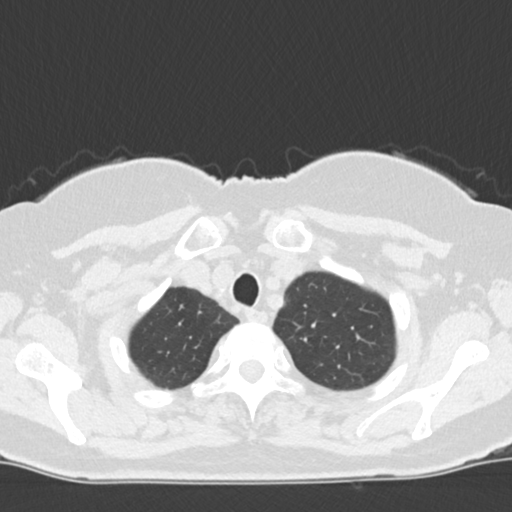
[im 145/157  lung]
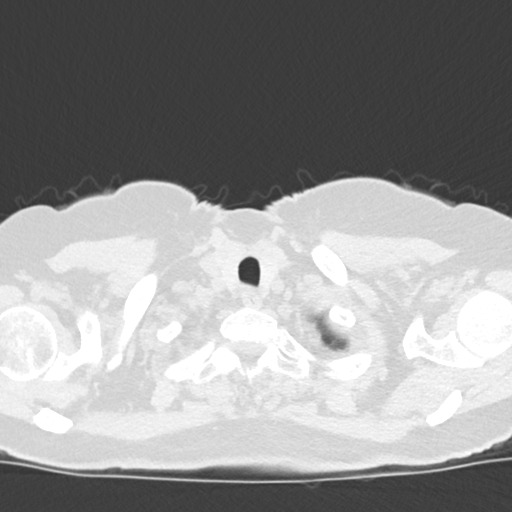

[Series 5: coronal · coronal · 0.63mm/px · 3 of 149 slices shown]
[im 30/149  lung]
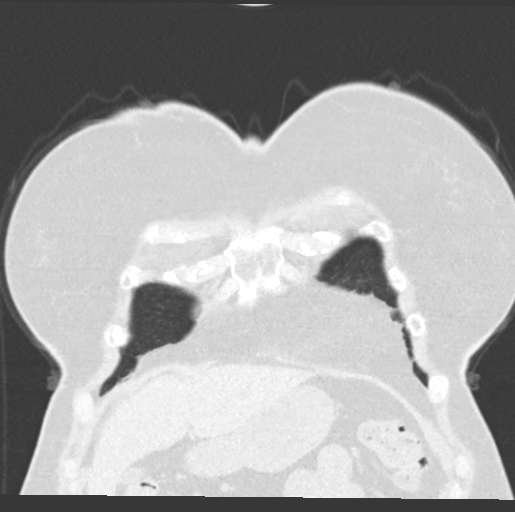
[im 60/149  lung]
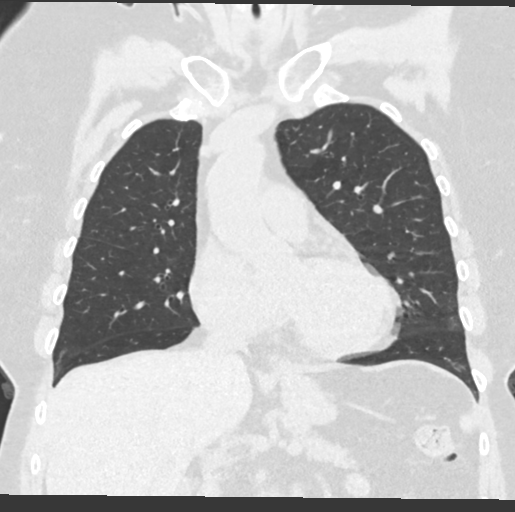
[im 89/149  lung]
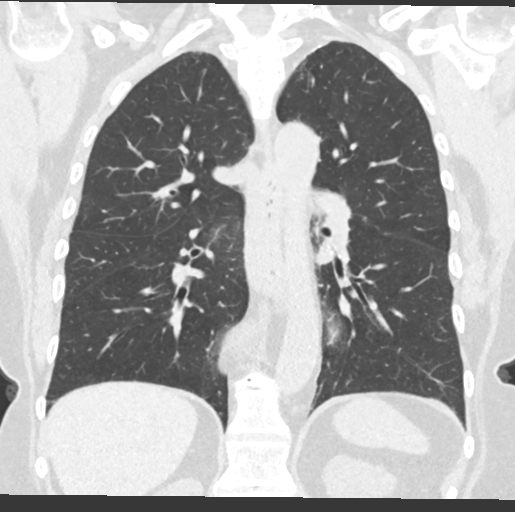

[15 of 36 positions shown; findings below may reference images not displayed]

FINDINGS: Evaluation of this exam is limited in the absence of intravenous
contrast.

Cardiovascular: There is no cardiomegaly or pericardial effusion.
Mild coronary vascular calcification. There is mild atherosclerotic
calcification thoracic aorta. No aneurysmal dilatation. The central
pulmonary arteries are grossly unremarkable.

Mediastinum/Nodes: There is no hilar or mediastinal adenopathy.
There is a moderate size hiatal hernia. The esophagus and the
thyroid gland are grossly unremarkable. No mediastinal fluid
collection.

Lungs/Pleura: There is a 3 mm right middle lobe nodule (89/4)
similar to prior CT. A 10 mm linear nodular density in the right
lower lobe (103/4) versus branching vessel, not significantly
changed since the prior CT. A 5 mm left lower lobe subpleural nodule
(99/4) stable. A 6 mm linear nodular density versus vascular branch
in the left upper lobe (39/4) similar or minimally increased in size
since 1812. There is a 5 mm nodule in the lingula (70/4) and a 4 mm
nodule in the lingula (72/4), increased in size since the prior CT.
No focal consolidation, pleural effusion, pneumothorax. The central
airways are patent.

Upper Abdomen: No acute abnormality.

Musculoskeletal: Degenerative changes of the spine. No acute osseous
pathology.
IMPRESSION: 1. No acute intrathoracic pathology.
2. Multiple bilateral pulmonary nodules as described. Most nodules
have been stable or minimally increased since 1812. Follow-up with
CT in 1 year recommended.
3. Moderate size hiatal hernia.
4. Aortic Atherosclerosis ([id number].W).

## 2022-04-24 ENCOUNTER — Ambulatory Visit: Payer: Federal, State, Local not specified - PPO | Admitting: Family Medicine

## 2022-04-24 ENCOUNTER — Encounter: Payer: Self-pay | Admitting: Family Medicine

## 2022-04-24 VITALS — BP 98/62 | HR 76 | Temp 97.1°F | Ht 70.0 in | Wt 185.8 lb

## 2022-04-24 DIAGNOSIS — R5382 Chronic fatigue, unspecified: Secondary | ICD-10-CM

## 2022-04-24 DIAGNOSIS — I480 Paroxysmal atrial fibrillation: Secondary | ICD-10-CM | POA: Diagnosis not present

## 2022-04-24 DIAGNOSIS — F5101 Primary insomnia: Secondary | ICD-10-CM | POA: Diagnosis not present

## 2022-04-24 DIAGNOSIS — E663 Overweight: Secondary | ICD-10-CM

## 2022-04-24 DIAGNOSIS — Z7901 Long term (current) use of anticoagulants: Secondary | ICD-10-CM

## 2022-04-24 DIAGNOSIS — E782 Mixed hyperlipidemia: Secondary | ICD-10-CM

## 2022-04-24 DIAGNOSIS — M85832 Other specified disorders of bone density and structure, left forearm: Secondary | ICD-10-CM

## 2022-04-24 DIAGNOSIS — R911 Solitary pulmonary nodule: Secondary | ICD-10-CM

## 2022-04-24 MED ORDER — ZOLPIDEM TARTRATE 5 MG PO TABS: 5.0000 mg | ORAL_TABLET | Freq: Every evening | ORAL | 5 refills | Status: DC | PRN

## 2022-04-24 MED ORDER — RIVAROXABAN 20 MG PO TABS: 20.0000 mg | ORAL_TABLET | Freq: Every day | ORAL | 3 refills | Status: DC

## 2022-04-24 NOTE — Progress Notes (Addendum)
Subjective: CC: 1 month follow-up PCP: Janora Norlander, DO [id number] Heather Keith is a 71 y.o. female presenting to clinic today for:  1.  Fatigue, A-fib, elevated BMI Patient reports ongoing fatigue.  She has been evaluated by her cardiologist who did not feel that this was a cardiac etiology.  She has been evaluated by pulmonology previously because she has dyspnea on exertion that is outside of her baseline.  Unfortunately, no etiology of symptoms have really been identified.  Much of this has been attributed to her COVID infection last year.  Her daughter, who is a PA, has administered B12 as well as given her prescription dose of vitamin D in efforts to improve her symptoms.  She was on high-dose vitamin D for a month but again did not really feel any changes in her energy levels after those supplementations.  She is not bleeding from anywhere.  She is compliant with her Xarelto and all medications as prescribed.  Does not feel like any of these issues were exacerbated by use of Wegovy.  She has had quite a bit of difficulty obtaining the 1 mg, which she is due for now.  Only the 1.7 mg are available at her pharmacy at this point and she was not sure if she should advance that dose or not.  She was not having any intolerance to that medication dose.  She continues to take Ambien as needed sleep.  Fatigue was not altered or exacerbated use of this medication.  2.  Hyperlipidemia Patient is compliant with Crestor 20 mg daily.  She has not had any myalgia or other concerning features with that medication like she was having with the previous meds.  Her friend takes Zetia and she is interested in potentially starting this if her LDL is still not at goal.  ROS: Per HPI  Allergies  Allergen Reactions   Penicillins Rash   Past Medical History:  Diagnosis Date   HTN (hypertension)    (patient denies)   Neck pain    Paroxysmal A-fib ([place])    CHADS2vasc =1   PONV (postoperative nausea and  vomiting)    and had a siezure once at dentist    Typical atrial flutter ([place])    Vasovagal syncope 04/28/13    Current Outpatient Medications:    acetaminophen (TYLENOL) 500 MG tablet, Take 500 mg by mouth every 6 (six) hours as needed (pain). , Disp: , Rfl:    albuterol (VENTOLIN HFA) 108 (90 Base) MCG/ACT inhaler, Inhale 2 puffs into the lungs every 6 (six) hours as needed for wheezing or shortness of breath., Disp: 8 g, Rfl: 0   Cholecalciferol (VITAMIN D3) 1.25 MG (50000 UT) CAPS, Take 1 capsule by mouth once a week., Disp: , Rfl:    Coenzyme Q10 (CO Q-10) 400 MG CAPS, Take 1 tablet by mouth daily., Disp: , Rfl:    escitalopram (LEXAPRO) 10 MG tablet, TAKE 1 TABLET BY MOUTH EVERY DAY, Disp: 90 tablet, Rfl: 1   flecainide (TAMBOCOR) 100 MG tablet, Take 1 tablet (100 mg total) by mouth 2 (two) times daily. Keep upcoming appt for future refills., Disp: 180 tablet, Rfl: 0   fluticasone (FLONASE) 50 MCG/ACT nasal spray, Place 2 sprays into both nostrils daily., Disp: 16 g, Rfl: 6   LORATADINE PO, Take 1 tablet by mouth daily., Disp: , Rfl:    Multiple Vitamins-Minerals (MULTIVITAMIN WITH MINERALS) tablet, Take 1 tablet by mouth daily., Disp: , Rfl:    Omega-3 1000 MG CAPS,  Take by mouth., Disp: , Rfl:    Probiotic Product (PROBIOTIC PO), Take 1 tablet by mouth daily., Disp: , Rfl:    rivaroxaban (XARELTO) 20 MG TABS tablet, Take 1 tablet (20 mg total) by mouth daily with supper. Place on file, Disp: 90 tablet, Rfl: 3   rosuvastatin (CRESTOR) 20 MG tablet, Take 1 tablet (20 mg total) by mouth every other day. To replace pravastatin, Disp: 45 tablet, Rfl: 3   Semaglutide-Weight Management (WEGOVY) 0.5 MG/0.5ML SOAJ, Inject 0.5 mg into the skin every 7 (seven) days. Month #2, Disp: 2 mL, Rfl: 0   Semaglutide-Weight Management (WEGOVY) 1 MG/0.5ML SOAJ, Inject 1 mg into the skin every 7 (seven) days. Month #3, Disp: 2 mL, Rfl: 0   Semaglutide-Weight Management (WEGOVY) 1.7 MG/0.75ML SOAJ, Inject 1.7  mg into the skin every 7 (seven) days. Month#4, Disp: 3 mL, Rfl: 0   zolpidem (AMBIEN) 5 MG tablet, Take 1 tablet (5 mg total) by mouth at bedtime as needed for sleep. Put on file, Disp: 30 tablet, Rfl: 5 Social History   Socioeconomic History   Marital status: Widowed    Spouse name: Not on file   Number of children: Not on file   Years of education: Not on file   Highest education level: Not on file  Occupational History   Not on file  Tobacco Use   Smoking status: Never   Smokeless tobacco: Never  Vaping Use   Vaping Use: Never used  Substance and Sexual Activity   Alcohol use: Yes    Comment: occasionally   Drug use: No   Sexual activity: Not on file  Other Topics Concern   Not on file  Social History Narrative   Not on file   Social Determinants of Health   Financial Resource Strain: Not on file  Food Insecurity: Not on file  Transportation Needs: Not on file  Physical Activity: Not on file  Stress: Not on file  Social Connections: Not on file  Intimate Partner Violence: Not on file   Family History  Problem Relation Age of Onset   Hodgkin's lymphoma Mother    Heart attack Father 68   Breast cancer Paternal Aunt    Diabetes Maternal Grandfather     Objective: Office vital signs reviewed. BP 98/62   Pulse 76   Temp (!) 97.1 F (36.2 C)   Ht '5\' 10"'  (1.778 m)   Wt 185 lb 12.8 oz (84.3 kg)   SpO2 95%   BMI 26.66 kg/m   Physical Examination:  General: Awake, alert, well-appearing, nontoxic female, No acute distress HEENT: No conjunctival pallor.  Sclera white.  Moist mucous membranes Cardio: regular rate and rhythm, S1S2 heard, no murmurs appreciated Pulm: clear to auscultation bilaterally, no wheezes, rhonchi or rales; normal work of breathing on room air Neuro: Vibratory sensation intact  Assessment/ Plan: 71 y.o. female   Paroxysmal atrial fibrillation (HCC) - Plan: rivaroxaban (XARELTO) 20 MG TABS tablet  Chronic anticoagulation  Primary  insomnia - Plan: zolpidem (AMBIEN) 5 MG tablet  Chronic fatigue - Plan: Vitamin B12, TSH, T4, Free, VITAMIN D 25 Hydroxy (Vit-D Deficiency, Fractures)  Osteopenia of left forearm - Plan: VITAMIN D 25 Hydroxy (Vit-D Deficiency, Fractures)  Incidental lung nodule, > 28m and < 849m- Plan: CT Chest Wo Contrast  Mixed hyperlipidemia - Plan: Lipid Panel  Overweight (BMI 25.0-29.9)  Rate and rhythm controlled.  Xarelto renewed  Insomnia is chronic and stable.  Ambien renewed.  No red flag signs or  symptoms.  Uncertain etiology of chronic fatigue but we will look for vitamin deficiency, thyroid abnormalities.  I did discuss with the patient that we should strongly consider referral to sleep medicine for further evaluation if her symptoms are not explained by any metabolic etiology.  I do agree that this could in fact be explained by long COVID syndrome but I am glad to leave no stone unturned and proceed with a referral to sleep medicine if she desires  I have reordered the CT chest without contrast to follow-up on the multiple lung nodules that were initially found back in 2021.  She had a CTA that was performed last July which did not see any malignant features of the nodules.  Lipid panel ordered.  Patient is interested in Zetia if she has not met her LDL goals.  Tolerating Crestor without difficulty.  Advised to proceed with 1 mg and not skip to the 1.7 mg as I would worry that her GI system would not be able to tolerate such a large jump from 0.5-1.7.  I advised her to call around to try and find the [facility] at 1 mg.  May need to consider doubling up on 0.62m if she can find that dose.  No orders of the defined types were placed in this encounter.  No orders of the defined types were placed in this encounter.  The Narcotic Database has been reviewed.  There were no red flags.     AJanora Norlander DO [facility]([phone]

## 2022-04-24 NOTE — Patient Instructions (Signed)
Call [facility] to see if they have wegovy 1mg 

## 2022-04-25 ENCOUNTER — Other Ambulatory Visit: Payer: Self-pay | Admitting: Family Medicine

## 2022-04-25 DIAGNOSIS — R5382 Chronic fatigue, unspecified: Secondary | ICD-10-CM

## 2022-04-25 DIAGNOSIS — G4719 Other hypersomnia: Secondary | ICD-10-CM

## 2022-04-25 LAB — LIPID PANEL
Chol/HDL Ratio: 2.4 ratio (ref 0.0–4.4)
Cholesterol, Total: 144 mg/dL (ref 100–199)
HDL: 60 mg/dL (ref >39)
LDL Chol Calc (NIH): 70 mg/dL (ref 0–99)
Triglycerides: 70 mg/dL (ref 0–149)
VLDL Cholesterol Cal: 14 mg/dL (ref 5–40)

## 2022-04-25 LAB — TSH: TSH: 1.62 u[IU]/mL (ref 0.450–4.500)

## 2022-04-25 LAB — VITAMIN B12: Vitamin B-12: 1228 pg/mL (ref 232–1245)

## 2022-04-25 LAB — T4, FREE: Free T4: 1.15 ng/dL (ref 0.82–1.77)

## 2022-04-25 LAB — VITAMIN D 25 HYDROXY (VIT D DEFICIENCY, FRACTURES): Vit D, 25-Hydroxy: 59.2 ng/mL (ref 30.0–100.0)

## 2022-04-30 ENCOUNTER — Encounter (INDEPENDENT_AMBULATORY_CARE_PROVIDER_SITE_OTHER): Payer: Federal, State, Local not specified - PPO | Admitting: Family Medicine

## 2022-04-30 DIAGNOSIS — I959 Hypotension, unspecified: Secondary | ICD-10-CM | POA: Diagnosis not present

## 2022-04-30 NOTE — Telephone Encounter (Signed)
Please see the MyChart message reply(ies) for my assessment and plan.    This patient gave consent for this Medical Advice Message and is aware that it may result in a bill to [employer], as well as the possibility of receiving a bill for a co-payment or deductible. They are an established patient, but are not seeking medical advice exclusively about a problem treated during an in person or video visit in the last seven days. I did not recommend an in person or video visit within seven days of my reply.    I spent a total of 5 minutes cumulative time within 7 days through [employer].  Orders Placed This Encounter  Procedures   CBC    Standing Status:   Future    Standing Expiration Date:   05/01/2023     Delynn Flavin, DO

## 2022-05-03 ENCOUNTER — Other Ambulatory Visit: Payer: Self-pay | Admitting: Family

## 2022-05-03 DIAGNOSIS — U071 COVID-19: Secondary | ICD-10-CM

## 2022-05-14 ENCOUNTER — Other Ambulatory Visit (HOSPITAL_COMMUNITY): Payer: Federal, State, Local not specified - PPO

## 2022-05-21 ENCOUNTER — Ambulatory Visit (HOSPITAL_COMMUNITY): Payer: Federal, State, Local not specified - PPO

## 2022-06-12 ENCOUNTER — Other Ambulatory Visit: Payer: Federal, State, Local not specified - PPO

## 2022-06-12 ENCOUNTER — Ambulatory Visit (INDEPENDENT_AMBULATORY_CARE_PROVIDER_SITE_OTHER): Payer: Federal, State, Local not specified - PPO | Admitting: Family Medicine

## 2022-06-12 ENCOUNTER — Encounter: Payer: Self-pay | Admitting: Family Medicine

## 2022-06-12 VITALS — BP 95/57 | HR 78 | Temp 96.3°F | Ht 70.0 in | Wt 183.2 lb

## 2022-06-12 DIAGNOSIS — I700 Atherosclerosis of aorta: Secondary | ICD-10-CM

## 2022-06-12 DIAGNOSIS — I480 Paroxysmal atrial fibrillation: Secondary | ICD-10-CM

## 2022-06-12 DIAGNOSIS — Z0001 Encounter for general adult medical examination with abnormal findings: Secondary | ICD-10-CM

## 2022-06-12 DIAGNOSIS — I959 Hypotension, unspecified: Secondary | ICD-10-CM

## 2022-06-12 DIAGNOSIS — F5101 Primary insomnia: Secondary | ICD-10-CM

## 2022-06-12 DIAGNOSIS — Z0000 Encounter for general adult medical examination without abnormal findings: Secondary | ICD-10-CM

## 2022-06-12 LAB — CBC
Hematocrit: 34.4 % (ref 34.0–46.6)
Hemoglobin: 10.9 g/dL — ABNORMAL LOW (ref 11.1–15.9)
MCH: 26.6 pg (ref 26.6–33.0)
MCHC: 31.7 g/dL (ref 31.5–35.7)
MCV: 84 fL (ref 79–97)
Platelets: 327 10*3/uL (ref 150–450)
RBC: 4.1 x10E6/uL (ref 3.77–5.28)
RDW: 13.7 % (ref 11.7–15.4)
WBC: 6 10*3/uL (ref 3.4–10.8)

## 2022-06-12 NOTE — Progress Notes (Unsigned)
Heather Keith is a 71 y.o. female presents to office today for annual physical exam examination.    Concerns today include: 1. ***  Occupation: ***, Marital status: ***, Substance use: *** Diet: ***, Exercise: *** Last eye exam: *** Last dental exam: *** Last colonoscopy: *** Last mammogram: *** Last pap smear: *** Refills needed today: *** Immunizations needed: Immunization History  Administered Date(s) Administered   Influenza Whole 07/02/2015   Influenza, High Dose Seasonal PF 07/09/2018, 07/15/2019   Influenza-Unspecified 07/13/2020   Pneumococcal Conjugate-13 07/05/2016   Pneumococcal Polysaccharide-23 10/17/2015, 07/05/2017   Tdap 12/22/1998, 10/17/2015, 04/18/2020   Zoster Recombinat (Shingrix) 07/09/2018, 10/07/2018     Past Medical History:  Diagnosis Date   HTN (hypertension)    (patient denies)   Neck pain    Paroxysmal A-fib (HCC)    CHADS2vasc =1   PONV (postoperative nausea and vomiting)    and had a siezure once at dentist    Typical atrial flutter (HCC)    Vasovagal syncope 04/28/13   Social History   Socioeconomic History   Marital status: Widowed    Spouse name: Not on file   Number of children: Not on file   Years of education: Not on file   Highest education level: Not on file  Occupational History   Not on file  Tobacco Use   Smoking status: Never   Smokeless tobacco: Never  Vaping Use   Vaping Use: Never used  Substance and Sexual Activity   Alcohol use: Yes    Comment: occasionally   Drug use: No   Sexual activity: Not on file  Other Topics Concern   Not on file  Social History Narrative   Not on file   Social Determinants of Health   Financial Resource Strain: Not on file  Food Insecurity: Not on file  Transportation Needs: Not on file  Physical Activity: Not on file  Stress: Not on file  Social Connections: Not on file  Intimate Partner Violence: Not on file   Past Surgical History:  Procedure Laterality Date    CARDIOVERSION N/A 04/22/2015   Procedure: CARDIOVERSION;  Surgeon: Quintella Reichert, MD;  Location: [facility] ENDOSCOPY;  Service: Cardiovascular;  Laterality: N/A;   KNEE SURGERY     TEE WITHOUT CARDIOVERSION N/A 04/22/2015   Procedure: TRANSESOPHAGEAL ECHOCARDIOGRAM (TEE);  Surgeon: Quintella Reichert, MD;  Location: [facility] ENDOSCOPY;  Service: Cardiovascular;  Laterality: N/A;   Family History  Problem Relation Age of Onset   Hodgkin's lymphoma Mother    Heart attack Father 60   Breast cancer Paternal Aunt    Diabetes Maternal Grandfather     Current Outpatient Medications:    acetaminophen (TYLENOL) 500 MG tablet, Take 500 mg by mouth every 6 (six) hours as needed (pain). , Disp: , Rfl:    Cholecalciferol (VITAMIN D3) 1.25 MG (50000 UT) CAPS, Take 1 capsule by mouth once a week., Disp: , Rfl:    Coenzyme Q10 (CO Q-10) 400 MG CAPS, Take 1 tablet by mouth daily., Disp: , Rfl:    escitalopram (LEXAPRO) 10 MG tablet, TAKE 1 TABLET BY MOUTH EVERY DAY, Disp: 90 tablet, Rfl: 1   flecainide (TAMBOCOR) 100 MG tablet, Take 1 tablet (100 mg total) by mouth 2 (two) times daily. Keep upcoming appt for future refills., Disp: 180 tablet, Rfl: 0   fluticasone (FLONASE) 50 MCG/ACT nasal spray, INSTILL TWO SPRAYS into IN EACH NOSTRIL DAILY, Disp: 16 g, Rfl: 6   LORATADINE PO, Take 1 tablet by  mouth daily., Disp: , Rfl:    Multiple Vitamins-Minerals (MULTIVITAMIN WITH MINERALS) tablet, Take 1 tablet by mouth daily., Disp: , Rfl:    Omega-3 1000 MG CAPS, Take by mouth., Disp: , Rfl:    Probiotic Product (PROBIOTIC PO), Take 1 tablet by mouth daily., Disp: , Rfl:    rivaroxaban (XARELTO) 20 MG TABS tablet, Take 1 tablet (20 mg total) by mouth daily with supper. Place on file, Disp: 90 tablet, Rfl: 3   rosuvastatin (CRESTOR) 20 MG tablet, Take 1 tablet (20 mg total) by mouth every other day. To replace pravastatin, Disp: 45 tablet, Rfl: 3   zolpidem (AMBIEN) 5 MG tablet, Take 1 tablet (5 mg total) by mouth at bedtime as  needed for sleep. Put on file, Disp: 30 tablet, Rfl: 5   albuterol (VENTOLIN HFA) 108 (90 Base) MCG/ACT inhaler, Inhale 2 puffs into the lungs every 6 (six) hours as needed for wheezing or shortness of breath. (Patient not taking: Reported on 06/12/2022), Disp: 8 g, Rfl: 0   Semaglutide-Weight Management (WEGOVY) 0.5 MG/0.5ML SOAJ, Inject 0.5 mg into the skin every 7 (seven) days. Month #2 (Patient not taking: Reported on 06/12/2022), Disp: 2 mL, Rfl: 0   Semaglutide-Weight Management (WEGOVY) 1 MG/0.5ML SOAJ, Inject 1 mg into the skin every 7 (seven) days. Month #3 (Patient not taking: Reported on 06/12/2022), Disp: 2 mL, Rfl: 0   Semaglutide-Weight Management (WEGOVY) 1.7 MG/0.75ML SOAJ, Inject 1.7 mg into the skin every 7 (seven) days. Month#4 (Patient not taking: Reported on 06/12/2022), Disp: 3 mL, Rfl: 0  Allergies  Allergen Reactions   Penicillins Rash     ROS: Review of Systems {ros; complete:30496}    Physical exam {Exam, [phone]}    Assessment/ Plan: Heather Keith here for annual physical exam.   No problem-specific Assessment & Plan notes found for this encounter.   Counseled on healthy lifestyle choices, including diet (rich in fruits, vegetables and lean meats and low in salt and simple carbohydrates) and exercise (at least 30 minutes of moderate physical activity daily).  Patient to follow up in 1 year for annual exam or sooner if needed.  Heather Keith M. Nadine Counts, DO

## 2022-06-13 ENCOUNTER — Ambulatory Visit (INDEPENDENT_AMBULATORY_CARE_PROVIDER_SITE_OTHER): Payer: Federal, State, Local not specified - PPO | Admitting: Neurology

## 2022-06-13 ENCOUNTER — Encounter: Payer: Self-pay | Admitting: Neurology

## 2022-06-13 ENCOUNTER — Encounter: Payer: Self-pay | Admitting: Family Medicine

## 2022-06-13 VITALS — BP 112/59 | HR 67 | Ht 70.0 in | Wt 186.2 lb

## 2022-06-13 DIAGNOSIS — R351 Nocturia: Secondary | ICD-10-CM

## 2022-06-13 DIAGNOSIS — E663 Overweight: Secondary | ICD-10-CM | POA: Diagnosis not present

## 2022-06-13 DIAGNOSIS — I480 Paroxysmal atrial fibrillation: Secondary | ICD-10-CM

## 2022-06-13 DIAGNOSIS — G4719 Other hypersomnia: Secondary | ICD-10-CM

## 2022-06-13 DIAGNOSIS — R0683 Snoring: Secondary | ICD-10-CM | POA: Diagnosis not present

## 2022-06-13 NOTE — Patient Instructions (Signed)

## 2022-06-13 NOTE — Progress Notes (Signed)
Subjective:    Patient ID: Heather Keith is a 71 y.o. female.  HPI    Huston Foley, MD, PhD [facility] [address] [address] [place], [place] [address]  Dear Dr. Nadine Counts,  I saw your patient, Heather Keith, upon your kind request in my sleep clinic today for initial consultation of her sleep disorder, in particular, concern for underlying obstructive sleep apnea.  The patient is unaccompanied today.  As you know, [name] is a 71 year old right-handed woman with an underlying medical history of paroxysmal atrial fibrillation, dyspnea on exertion, history of COVID, hyperlipidemia, hypertension, neck pain, history of vasovagal syncope, and mildly overweight state, who reports snoring and excessive daytime somnolence. I reviewed your office note from 04/24/2022.  She has had difficulty initiating and maintaining sleep.  She is on on Ambien 5 mg as needed.  She had blood work through your office on 04/24/2022 and I reviewed the results: Vitamin B12 was 1228, free T4 normal at 1.15, vitamin D level 59.2, lipid panel with benign results, total cholesterol 144, triglycerides 70, HDL 60, LDL 70.  TSH normal at 1.62.  She is followed by cardiology. Her Epworth sleepiness score is 2 out of 24, fatigue severity score is 33 out of 63.  She has had increase in fatigue since she had COVID.  She had COVID in January 2022 and started having more tiredness in March 2022, wanted to sleep all the time.  She has no family history of sleep apnea.  She snores occasionally.  She is widowed, she lives alone, has 2 dogs in the household, one of the dog sleeps on the bed with her.  She does not have a TV in her bedroom.  She generally goes to bed around 11, rise time is around 8:30 AM.  She has a grown daughter who is a PA.  She is a non-smoker and drinks alcohol very occasionally, limits herself to 1 drink.  She has been on Xarelto for years, has a history of blood clot.  She has  a history of paroxysmal A-fib.  She has had occasional dizziness.  She has had low blood pressure values.  She is trying to hydrate well with water.  She does not drink any daily caffeine.  She is a side sleeper.  She denies recurrent morning or nocturnal headaches, has nocturia anywhere between 1 and 4 times per night on average.  She uses a bite guard for bruxism.  She had a tonsillectomy as an adult.  Her Past Medical History Is Significant For: Past Medical History:  Diagnosis Date   HTN (hypertension)    (patient denies)   Neck pain    Paroxysmal A-fib (HCC)    CHADS2vasc =1   PONV (postoperative nausea and vomiting)    and had a siezure once at dentist    Typical atrial flutter (HCC)    Vasovagal syncope 04/28/13    Her Past Surgical History Is Significant For: Past Surgical History:  Procedure Laterality Date   CARDIOVERSION N/A 04/22/2015   Procedure: CARDIOVERSION;  Surgeon: Quintella Reichert, MD;  Location: [facility] ENDOSCOPY;  Service: Cardiovascular;  Laterality: N/A;   KNEE SURGERY     TEE WITHOUT CARDIOVERSION N/A 04/22/2015   Procedure: TRANSESOPHAGEAL ECHOCARDIOGRAM (TEE);  Surgeon: Quintella Reichert, MD;  Location: [facility] ENDOSCOPY;  Service: Cardiovascular;  Laterality: N/A;    Her Family History Is Significant For: Family History  Problem Relation Age of Onset   Hodgkin's lymphoma Mother  Heart attack Father 37   Breast cancer Paternal Aunt    Diabetes Maternal Grandfather    Sleep apnea Neg Hx     Her Social History Is Significant For: Social History   Socioeconomic History   Marital status: Widowed    Spouse name: Not on file   Number of children: Not on file   Years of education: Not on file   Highest education level: Not on file  Occupational History   Not on file  Tobacco Use   Smoking status: Never   Smokeless tobacco: Never  Vaping Use   Vaping Use: Never used  Substance and Sexual Activity   Alcohol use: Yes    Comment: occasionally   Drug use: No    Sexual activity: Not on file  Other Topics Concern   Not on file  Social History Narrative   Not on file   Social Determinants of Health   Financial Resource Strain: Not on file  Food Insecurity: Not on file  Transportation Needs: Not on file  Physical Activity: Not on file  Stress: Not on file  Social Connections: Not on file    Her Allergies Are:  Allergies  Allergen Reactions   Penicillins Rash  :   Her Current Medications Are:  Outpatient Encounter Medications as of 06/13/2022  Medication Sig   acetaminophen (TYLENOL) 500 MG tablet Take 500 mg by mouth every 6 (six) hours as needed (pain).    Cholecalciferol (VITAMIN D3) 1.25 MG (50000 UT) CAPS Take 1 capsule by mouth once a week.   Coenzyme Q10 (CO Q-10) 400 MG CAPS Take 1 tablet by mouth daily.   escitalopram (LEXAPRO) 10 MG tablet TAKE 1 TABLET BY MOUTH EVERY DAY   flecainide (TAMBOCOR) 100 MG tablet Take 1 tablet (100 mg total) by mouth 2 (two) times daily. Keep upcoming appt for future refills.   fluticasone (FLONASE) 50 MCG/ACT nasal spray INSTILL TWO SPRAYS into IN EACH NOSTRIL DAILY   LORATADINE PO Take 1 tablet by mouth daily.   Multiple Vitamins-Minerals (MULTIVITAMIN WITH MINERALS) tablet Take 1 tablet by mouth daily.   Omega-3 1000 MG CAPS Take by mouth.   Probiotic Product (PROBIOTIC PO) Take 1 tablet by mouth daily.   rivaroxaban (XARELTO) 20 MG TABS tablet Take 1 tablet (20 mg total) by mouth daily with supper. Place on file   rosuvastatin (CRESTOR) 20 MG tablet Take 1 tablet (20 mg total) by mouth every other day. To replace pravastatin   zolpidem (AMBIEN) 5 MG tablet Take 1 tablet (5 mg total) by mouth at bedtime as needed for sleep. Put on file   albuterol (VENTOLIN HFA) 108 (90 Base) MCG/ACT inhaler Inhale 2 puffs into the lungs every 6 (six) hours as needed for wheezing or shortness of breath. (Patient not taking: Reported on 06/12/2022)   Semaglutide-Weight Management (WEGOVY) 0.5 MG/0.5ML SOAJ Inject 0.5  mg into the skin every 7 (seven) days. Month #2 (Patient not taking: Reported on 06/12/2022)   Semaglutide-Weight Management (WEGOVY) 1 MG/0.5ML SOAJ Inject 1 mg into the skin every 7 (seven) days. Month #3 (Patient not taking: Reported on 06/12/2022)   Semaglutide-Weight Management (WEGOVY) 1.7 MG/0.75ML SOAJ Inject 1.7 mg into the skin every 7 (seven) days. Month#4 (Patient not taking: Reported on 06/12/2022)   No facility-administered encounter medications on file as of 06/13/2022.  :   Review of Systems:  Out of a complete 14 point review of systems, all are reviewed and negative with the exception of these symptoms as  listed below:  Review of Systems  Neurological:        Pt here for sleep consult   Pt states some fatigue,hypotension.  Pt denies snoring ,headaches,sleep study.CPAP machine    ESS:2 FSS:33    Objective:  Neurological Exam  Physical Exam Physical Examination:   Vitals:   06/13/22 1338  BP: (!) 112/59  Pulse: 67    General Examination: The patient is a very pleasant 71 y.o. female in no acute distress. She appears well-developed and well-nourished and well groomed.   HEENT: Normocephalic, atraumatic, pupils are equal, round and reactive to light, extraocular tracking is good without limitation to gaze excursion or nystagmus noted. Hearing is grossly intact. Face is symmetric with normal facial animation. Speech is clear with no dysarthria noted. There is no hypophonia. There is no lip, neck/head, jaw or voice tremor. Neck is supple with full range of passive and active motion. There are no carotid bruits on auscultation. Oropharynx exam reveals: mild mouth dryness, adequate dental hygiene and mild airway crowding, due to redundant soft palate, Mallampati class III.  Tonsils absent, minimal to mild overbite.  Tongue protrudes centrally and palate elevates symmetrically.  Chest: Clear to auscultation without wheezing, rhonchi or crackles noted.  Heart: S1+S2+0,  regular and normal without murmurs, rubs or gallops noted.   Abdomen: Soft, non-tender and non-distended.  Extremities: There is no obvious edema.   Skin: Warm and dry without trophic changes noted.   Musculoskeletal: exam reveals no obvious joint deformities, tenderness or joint swelling or erythema.   Neurologically:  Mental status: The patient is awake, alert and oriented in all 4 spheres. Her immediate and remote memory, attention, language skills and fund of knowledge are appropriate. There is no evidence of aphasia, agnosia, apraxia or anomia. Speech is clear with normal prosody and enunciation. Thought process is linear. Mood is normal and affect is normal.  Cranial nerves II - XII are as described above under HEENT exam.  Motor exam: Normal bulk, strength and tone is noted. There is no obvious tremor.  Fine motor skills and coordination: grossly intact.  Cerebellar testing: No dysmetria or intention tremor. There is no truncal or gait ataxia.  Sensory exam: intact to light touch in the upper and lower extremities.  Gait, station and balance: She stands easily. No veering to one side is noted. No leaning to one side is noted. Posture is age-appropriate and stance is narrow based. Gait shows normal stride length and normal pace. No problems turning are noted.  Assessment and Plan:  In summary, TAIS KOESTNER is a very pleasant 71 y.o.-year old female with an underlying medical history of paroxysmal atrial fibrillation, dyspnea on exertion, history of COVID, hyperlipidemia, hypertension, neck pain, history of vasovagal syncope, and mildly overweight state, whose history and physical exam are concerning for sleep disordered breathing, including obstructive sleep apnea (OSA).  Given her history of paroxysmal A-fib, a laboratory attended sleep study is considered gold standard for evaluation of sleep disordered breathing and is recommended at this time and clinically justified.   I had a long  chat with the patient about my findings and the diagnosis of sleep apnea, particularly OSA, its prognosis and treatment options. We talked about medical/conservative treatments, surgical interventions and non-pharmacological approaches for symptom control. I explained, in particular, the risks and ramifications of untreated moderate to severe OSA, especially with respect to developing cardiovascular disease down the road, including congestive heart failure (CHF), difficult to treat hypertension, cardiac arrhythmias (particularly A-fib), neurovascular  complications including TIA, stroke and dementia. Even type 2 diabetes has, in part, been linked to untreated OSA. Symptoms of untreated OSA may include (but may not be limited to) daytime sleepiness, nocturia (i.e. frequent nighttime urination), memory problems, mood irritability and suboptimally controlled or worsening mood disorder such as depression and/or anxiety, lack of energy, lack of motivation, physical discomfort, as well as recurrent headaches, especially morning or nocturnal headaches. We talked about the importance of maintaining a healthy lifestyle and striving for healthy weight. In addition, we talked about the importance of striving for and maintaining good sleep hygiene. I recommended the following at this time: sleep study.  I outlined the differences between a laboratory attended sleep study which is considered more comprehensive and accurate over the option of a home sleep test (HST); the latter may lead to underestimation of sleep disordered breathing in some instances and does not help with diagnosing upper airway resistance syndrome and is not accurate enough to diagnose primary central sleep apnea typically. I explained the different sleep test procedures to the patient in detail and also outlined possible surgical and non-surgical treatment options of OSA, including the use of a pressure airway pressure (PAP) device (ie CPAP, AutoPAP/APAP or  BiPAP in certain circumstances), a custom-made dental device (aka oral appliance, which would require a referral to a specialist dentist or orthodontist typically, and is generally speaking not considered a good choice for patients with full dentures or edentulous state), upper airway surgical options, such as traditional UPPP (which is not considered a first-line treatment) or the Inspire device (hypoglossal nerve stimulator, which would involve a referral for consultation with an ENT surgeon, after careful selection, following inclusion criteria). I explained the PAP treatment option to the patient in detail, as this is generally considered first-line treatment.  The patient indicated that she would be rather reluctant to try PAP therapy, as she fears that she would be too uncomfortable.  She is agreeable to pursuing testing at this time.  She feels that she would sleep better at home versus in the sleep lab. We will pick up our discussion about the next steps and treatment options after testing.  We will keep her posted as to the test results by phone call and/or MyChart messaging where possible.  We will plan to follow-up in sleep clinic accordingly as well.  I answered all her questions today and the patient was in agreement.   I encouraged her to call with any interim questions, concerns, problems or updates or email [place] through MyChart.  Generally speaking, sleep test authorizations may take up to 2 weeks, sometimes less, sometimes longer, the patient is encouraged to get in touch with [place] if they do not hear back from the sleep lab staff directly within the next 2 weeks.  Thank you very much for allowing me to participate in the care of this nice patient. If I can be of any further assistance to you please do not hesitate to call me at [phone].  Sincerely,   Huston Foley, MD, PhD

## 2022-06-14 ENCOUNTER — Ambulatory Visit (HOSPITAL_COMMUNITY)
Admission: RE | Admit: 2022-06-14 | Discharge: 2022-06-14 | Disposition: A | Discharge status: Home / Self Care | Payer: Federal, State, Local not specified - PPO | Source: Ambulatory Visit | Attending: Family Medicine | Admitting: Family Medicine

## 2022-06-14 DIAGNOSIS — R911 Solitary pulmonary nodule: Secondary | ICD-10-CM | POA: Insufficient documentation

## 2022-06-18 ENCOUNTER — Other Ambulatory Visit: Payer: Self-pay | Admitting: Family Medicine

## 2022-06-18 DIAGNOSIS — R918 Other nonspecific abnormal finding of lung field: Secondary | ICD-10-CM

## 2022-06-18 DIAGNOSIS — R911 Solitary pulmonary nodule: Secondary | ICD-10-CM

## 2022-06-18 DIAGNOSIS — J84112 Idiopathic pulmonary fibrosis: Secondary | ICD-10-CM

## 2022-06-19 ENCOUNTER — Encounter: Payer: Self-pay | Admitting: Family Medicine

## 2022-06-19 ENCOUNTER — Other Ambulatory Visit: Payer: Federal, State, Local not specified - PPO

## 2022-06-19 DIAGNOSIS — D649 Anemia, unspecified: Secondary | ICD-10-CM

## 2022-06-20 ENCOUNTER — Other Ambulatory Visit: Payer: Federal, State, Local not specified - PPO

## 2022-06-20 LAB — FERRITIN: Ferritin: 30 ng/mL (ref 15–150)

## 2022-06-20 LAB — IRON: Iron: 53 ug/dL (ref 27–139)

## 2022-06-20 LAB — VITAMIN B12: Vitamin B-12: 845 pg/mL (ref 232–1245)

## 2022-06-22 LAB — FECAL OCCULT BLOOD, IMMUNOCHEMICAL: Fecal Occult Bld: NEGATIVE

## 2022-06-27 ENCOUNTER — Telehealth: Payer: Self-pay | Admitting: Interventional Cardiology

## 2022-06-27 NOTE — Telephone Encounter (Signed)
Patient is calling stating she received a letter from her insurance stating they need further information to support the insurance claim to cover her echo. She states they are requesting a signed document of medical necessity as well as any OV notes or records regarding it. Patient would like a callback to be updated on the status. Please advise.

## 2022-07-03 ENCOUNTER — Other Ambulatory Visit: Payer: Self-pay | Admitting: Internal Medicine

## 2022-07-03 ENCOUNTER — Other Ambulatory Visit: Payer: Federal, State, Local not specified - PPO

## 2022-07-03 ENCOUNTER — Other Ambulatory Visit: Payer: Self-pay | Admitting: Family Medicine

## 2022-07-03 DIAGNOSIS — D649 Anemia, unspecified: Secondary | ICD-10-CM

## 2022-07-03 NOTE — Telephone Encounter (Signed)
Rx refill sent to pharmacy. 

## 2022-07-04 LAB — CBC
Hematocrit: 34 % (ref 34.0–46.6)
Hemoglobin: 10.9 g/dL — ABNORMAL LOW (ref 11.1–15.9)
MCH: 26.9 pg (ref 26.6–33.0)
MCHC: 32.1 g/dL (ref 31.5–35.7)
MCV: 84 fL (ref 79–97)
Platelets: 248 10*3/uL (ref 150–450)
RBC: 4.05 x10E6/uL (ref 3.77–5.28)
RDW: 14.3 % (ref 11.7–15.4)
WBC: 6.3 10*3/uL (ref 3.4–10.8)

## 2022-07-17 ENCOUNTER — Telehealth: Payer: Self-pay | Admitting: Neurology

## 2022-07-17 NOTE — Telephone Encounter (Signed)
[employer] pending faxed notes  

## 2022-07-24 ENCOUNTER — Other Ambulatory Visit: Payer: Self-pay | Admitting: Family Medicine

## 2022-07-24 ENCOUNTER — Telehealth: Payer: Self-pay | Admitting: Internal Medicine

## 2022-07-24 ENCOUNTER — Encounter: Payer: Self-pay | Admitting: Internal Medicine

## 2022-07-24 ENCOUNTER — Ambulatory Visit: Payer: Federal, State, Local not specified - PPO | Admitting: Internal Medicine

## 2022-07-24 VITALS — BP 106/72 | HR 63 | Temp 98.3°F | Ht 70.0 in | Wt 181.2 lb

## 2022-07-24 DIAGNOSIS — K449 Diaphragmatic hernia without obstruction or gangrene: Secondary | ICD-10-CM | POA: Diagnosis not present

## 2022-07-24 DIAGNOSIS — J849 Interstitial pulmonary disease, unspecified: Secondary | ICD-10-CM | POA: Diagnosis not present

## 2022-07-24 DIAGNOSIS — R0609 Other forms of dyspnea: Secondary | ICD-10-CM | POA: Diagnosis not present

## 2022-07-24 DIAGNOSIS — R4589 Other symptoms and signs involving emotional state: Secondary | ICD-10-CM

## 2022-07-24 LAB — SEDIMENTATION RATE: Sed Rate: 18 mm/hr (ref 0–30)

## 2022-07-24 NOTE — Telephone Encounter (Signed)
  For radiology addendum:   - please call radiologist assistant line [phone] and specify Heather Keith , [birth year] and [phone] - mention imaging type CT chest 89/14/23 and 08/10/2020 and 2016 and date  - request addendum for purpose of - Dr Yetta Glassman 2nd opinion review to see if there is ILD v ILA and when onset   Please send phone message back when done  Thanks    SIGNATURE    Dr. Brand Males, M.D., F.C.C.P,  Pulmonary and Critical Care Medicine Staff Physician, [place] Director - Interstitial Lung Disease  Program  Pulmonary [place] [facility] [place], [place], [address]  Pager: [phone], If no answer  OR between  19:00-7:00h: page [phone] Telephone (clinical office): [phone] Telephone (research): [phone]  12:02 PM 07/24/2022

## 2022-07-24 NOTE — Patient Instructions (Addendum)
ICD-10-CM   1. DOE (dyspnea on exertion)  R06.09     2. ILD (interstitial lung disease) ([place])  J84.9     3. Hiatal hernia  K44.9         - I am concerned you might  have Interstitial Lung Disease (ILD) - currently mild and unclear if it is cause of all yoru shortness of breath  -  There are MANY varieties of this - - To narrow down possibilities and assess severity please do the following tests  - do  full PFT anytime - can be at [facility]  - do walking test on room air in the office (not 6 min walk test) this visit or next visit  -  do autoimmune panel: Serum: ESR, ANA, DS-DNA, RF, anti-CCP, ssA, ssB, scl-70, ANCA, MPO and PR-3 antibodies, Total CK,  Aldolase,  Hypersensitivity Pneumonitis Panel and Quantiferon Gold  - will ask our thoracic radiologist to review your CT scan    Followup - Dec 2023/Jan 2024 to discuss nex step

## 2022-07-24 NOTE — Progress Notes (Unsigned)
OV 07/24/2022  Subjective:  Patient ID: Heather Keith, female , DOB: May 22, 1951 , age 71 y.o. , MRN: [id number] , ADDRESS: [address] [place] [address] PCP Janora Norlander, DO Patient Care Team: Janora Norlander, DO as PCP - General (Family Medicine) Evans Lance, MD as Consulting Physician (Cardiology)  This Provider for this visit: Treatment Team:  Attending Provider: Brand Males, MD    07/24/2022 -   Chief Complaint  Patient presents with   Consult    PT states since Covid in 2022, chronic DOE Pulmonary Fibrosis on CT 05/2022     HPI Heather Keith 71 y.o. -    CT Chest data  No results found.    PFT      No data to display          Latest Reference Range & Units 04/28/13 18:14 04/20/15 15:59 01/15/20 09:59 01/16/21 08:55 04/12/21 14:47 05/05/21 11:28 12/11/21 09:55 03/20/22 14:19 06/12/22 09:27 07/03/22 14:28  Hemoglobin 11.1 - 15.9 g/dL 14.6 15.2 (H) 13.1 12.7 12.6 11.6 13.4 11.4 10.9 (L) 10.9 (L)  (H): Data is abnormally high (L): Data is abnormally low  ANA - neg July 2022  D-dimer 6.5 in July 2022   CPEX Aug 2022:  -  CPX:  Exercise testing with gas exchange demonstrates a normal peak VO2 of 16.7 ml/kg/min (104% of the age/gender/weight matched sedentary norms). The RER of 1.10 indicates a maximal effort. When adjusted to the patient's ideal body weight of 124 lb (56.3 kg) the peak VO2 is 26.46ml/kg (ibw)/min (123% of the ibw-adjusted predicted). The VE/VCO2 slope is normal. The oxygen uptake efficiency slope (OUES) is normal. The VO2 at the ventilatory threshold was normal at 81% of the predicted peak VO2. At peak exercise, the ventilation reached 51% of the measured MVV indicating ventilatory reserve remained. The O2pulse (a surrogate for stroke volume) increased with initial exercise, with little to no increase beyond initial exercise, at a peak of 12 ml/beat (134% predicted).    Conclusion: Exercise testing  with gas exchange demonstrates normal functional capacity when compared to matched sedentary norms. There is no evidence for cardiopulmonary abnormality. There was mild chronotropic incompetence.    CT chest without contrast (not a HRCT - sept 2023 IMPRESSION: 1. No acute intrathoracic process. 2. Scattered subcentimeter partially calcified pulmonary nodules, demonstrating long-term stability and consistent with benign postinflammatory or postinfectious change. 3. Progressive subpleural scarring/fibrosis, greatest in the mid to lower lung zones. Pattern can be seen with UIP/IPF. If further evaluation is desired, high-resolution chest CT could be considered. -> Was not reporte din 2021 CT 4. Stable moderate hiatal hernia. 5.  Aortic Atherosclerosis (ICD10-I70.0).     Electronically Signed   By: Randa Ngo M.D.   On: 06/16/2022 16:04   ECHO summer 2023  -  Normal   has a past medical history of HTN (hypertension), Neck pain, Paroxysmal A-fib ([place]), PONV (postoperative nausea and vomiting), Typical atrial flutter ([place]), and Vasovagal syncope (04/28/13).   reports that she has never smoked. She has never used smokeless tobacco.  Past Surgical History:  Procedure Laterality Date   CARDIOVERSION N/A 04/22/2015   Procedure: CARDIOVERSION;  Surgeon: Sueanne Margarita, MD;  Location: [facility];  Service: Cardiovascular;  Laterality: N/A;   KNEE SURGERY     TEE WITHOUT CARDIOVERSION N/A 04/22/2015   Procedure: TRANSESOPHAGEAL ECHOCARDIOGRAM (TEE);  Surgeon: Sueanne Margarita, MD;  Location: [facility];  Service: Cardiovascular;  Laterality: N/A;  Allergies  Allergen Reactions   Penicillins Rash    Immunization History  Administered Date(s) Administered   Fluad Quad(high Dose 65+) 07/06/2022   Influenza Whole 07/02/2015   Influenza, High Dose Seasonal PF 07/09/2018, 07/15/2019   Influenza-Unspecified 07/13/2020   Pneumococcal Conjugate-13 07/05/2016   Pneumococcal  Polysaccharide-23 10/17/2015, 07/05/2017   Tdap 12/22/1998, 10/17/2015, 04/18/2020   Zoster Recombinat (Shingrix) 07/09/2018, 10/07/2018    Family History  Problem Relation Age of Onset   Hodgkin's lymphoma Mother    Heart attack Father 52   Breast cancer Paternal Aunt    Diabetes Maternal Grandfather    Sleep apnea Neg Hx      Current Outpatient Medications:    acetaminophen (TYLENOL) 500 MG tablet, Take 500 mg by mouth every 6 (six) hours as needed (pain). , Disp: , Rfl:    Cholecalciferol (VITAMIN D3) 1.25 MG (50000 UT) CAPS, Take 1 capsule by mouth once a week., Disp: , Rfl:    Coenzyme Q10 (CO Q-10) 400 MG CAPS, Take 1 tablet by mouth daily., Disp: , Rfl:    escitalopram (LEXAPRO) 10 MG tablet, TAKE 1 TABLET BY MOUTH EVERY DAY, Disp: 90 tablet, Rfl: 0   flecainide (TAMBOCOR) 100 MG tablet, TAKE 1 TABLET BY MOUTH TWICE DAILY, Disp: 180 tablet, Rfl: 2   fluticasone (FLONASE) 50 MCG/ACT nasal spray, INSTILL TWO SPRAYS into IN EACH NOSTRIL DAILY, Disp: 16 g, Rfl: 6   LORATADINE PO, Take 1 tablet by mouth daily., Disp: , Rfl:    Multiple Vitamins-Minerals (MULTIVITAMIN WITH MINERALS) tablet, Take 1 tablet by mouth daily., Disp: , Rfl:    Omega-3 1000 MG CAPS, Take by mouth., Disp: , Rfl:    Probiotic Product (PROBIOTIC PO), Take 1 tablet by mouth daily., Disp: , Rfl:    rivaroxaban (XARELTO) 20 MG TABS tablet, Take 1 tablet (20 mg total) by mouth daily with supper. Place on file, Disp: 90 tablet, Rfl: 3   rosuvastatin (CRESTOR) 20 MG tablet, Take 1 tablet (20 mg total) by mouth every other day. To replace pravastatin, Disp: 45 tablet, Rfl: 3   zolpidem (AMBIEN) 5 MG tablet, Take 1 tablet (5 mg total) by mouth at bedtime as needed for sleep. Put on file, Disp: 30 tablet, Rfl: 5   albuterol (VENTOLIN HFA) 108 (90 Base) MCG/ACT inhaler, Inhale 2 puffs into the lungs every 6 (six) hours as needed for wheezing or shortness of breath. (Patient not taking: Reported on 06/12/2022), Disp: 8 g,  Rfl: 0      Objective:   Vitals:   07/24/22 1134  BP: 106/72  Pulse: 63  Temp: 98.3 F (36.8 C)  TempSrc: Oral  SpO2: 99%  Weight: 181 lb 3.2 oz (82.2 kg)  Height: 5\' 10"  (1.778 m)    Estimated body mass index is 26 kg/m as calculated from the following:   Height as of this encounter: 5\' 10"  (1.778 m).   Weight as of this encounter: 181 lb 3.2 oz (82.2 kg).  @WEIGHTCHANGE @    07/24/22 1134  Weight: 181 lb 3.2 oz (82.2 kg)     Physical Exam  General Appearance:    Alert, cooperative, no distress, appears stated age - *** , Deconditioned looking - *** , OBESE  - ***, Sitting on Wheelchair -  ***  Head:    Normocephalic, without obvious abnormality, atraumatic  Eyes:    PERRL, conjunctiva/corneas clear,  Ears:    Normal TM's and external ear canals, both ears  Nose:   Nares normal,  septum midline, mucosa normal, no drainage    or sinus tenderness. OXYGEN ON  - *** . Patient is @ ***   Throat:   Lips, mucosa, and tongue normal; teeth and gums normal. Cyanosis on lips - ***  Neck:   Supple, symmetrical, trachea midline, no adenopathy;    thyroid:  no enlargement/tenderness/nodules; no carotid   bruit or JVD  Back:     Symmetric, no curvature, ROM normal, no CVA tenderness  Lungs:     Distress - *** , Wheeze ***, Barrell Chest - ***, Purse lip breathing - ***, Crackles - ***   Chest Wall:    No tenderness or deformity.    Heart:    Regular rate and rhythm, S1 and S2 normal, no rub   or gallop, Murmur - ***  Breast Exam:    NOT DONE  Abdomen:     Soft, non-tender, bowel sounds active all four quadrants,    no masses, no organomegaly. Visceral obesity - ***  Genitalia:   NOT DONE  Rectal:   NOT DONE  Extremities:   Extremities - normal, Has Cane - ***, Clubbing - ***, Edema - ***  Pulses:   2+ and symmetric all extremities  Skin:   Stigmata of Connective Tissue Disease - ***  Lymph nodes:   Cervical, supraclavicular, and axillary nodes normal   Psychiatric:  Neurologic:   Pleasant - ***, Anxious - ***, Flat affect - ***  CAm-ICU - neg, Alert and Oriented x 3 - yes, Moves all 4s - yes, Speech - normal, Cognition - intact    General: No distress. *** Neuro: Alert and Oriented x 3. GCS 15. Speech normal Psych: Pleasant Resp:  Barrel Chest - ***.  Wheeze - ***, Crackles - ***, No overt respiratory distress CVS: Normal heart sounds. Murmurs - *** Ext: Stigmata of Connective Tissue Disease - *** HEENT: Normal upper airway. PEERL +. No post nasal drip        Assessment:       ICD-10-CM   1. ILD (interstitial lung disease) (HCC)  J84.9     2. Hiatal hernia  K44.9     3. Multiple pulmonary nodules determined by computed tomography of lung  R91.8          Plan:     There are no Patient Instructions on file for this visit.    SIGNATURE    Dr. Kalman Shan, M.D., F.C.C.P,  Pulmonary and Critical Care Medicine Staff Physician, [facility] Director - Interstitial Lung Disease  Program  Pulmonary Fibrosis [facility] [place], [place], [address]  Pager: [phone], If no answer or between  15:00h - 7:00h: call [phone] Telephone: [phone]  11:40 AM 07/24/2022

## 2022-07-25 ENCOUNTER — Telehealth: Payer: Self-pay | Admitting: Internal Medicine

## 2022-07-25 LAB — CK TOTAL AND CKMB (NOT AT ARMC)
CK, MB: <0.7 ng/mL (ref 0–5.0)
Total CK: 46 U/L (ref 29–143)

## 2022-07-25 NOTE — Telephone Encounter (Signed)
[facility] and spoke Levander Campion letting her know the addendum MR wanted to have done. Dianne stated she would send this over. Routing back to MR.

## 2022-07-25 NOTE — Telephone Encounter (Signed)
Please let patient know that Dr. Burt Ek thoracic radiologist looked at the scans.  She is not sure if there is ILD or not even though patient has risk factors for ILD.  Plan - Continue with current plan of getting pulmonary function test and blood work -If these look okay then we will do the same for high-resolution CT scan of the chest in the summer 2024 - Plan to be developed more precisely during follow-up

## 2022-07-26 LAB — ALDOLASE: Aldolase: 3.8 U/L (ref <=8.1)

## 2022-07-26 LAB — ANCA PROFILE
Anti-MPO Antibodies: <0.2 units (ref 0.0–0.9)
Anti-PR3 Antibodies: <0.2 units (ref 0.0–0.9)
Atypical pANCA: <1:20 {titer}
C-ANCA: <1:20 {titer}
P-ANCA: <1:20 {titer}

## 2022-07-26 LAB — CK TOTAL AND CKMB (NOT AT ARMC)
CK, MB: <0.7 ng/mL (ref 0–5.0)
Total CK: 46 U/L (ref 29–143)

## 2022-07-26 NOTE — Telephone Encounter (Signed)
Patient returning call.

## 2022-07-26 NOTE — Telephone Encounter (Signed)
Attempted to call pt but unable to reach. Left message for her to return call. 

## 2022-07-26 NOTE — Telephone Encounter (Signed)
Called and spoke with patient. She verbalized understanding.   Nothing further needed at time of call.  

## 2022-07-27 LAB — HYPERSENSITIVITY PNEUMONITIS
A. Pullulans Abs: NEGATIVE
A.Fumigatus #1 Abs: NEGATIVE
Micropolyspora faeni, IgG: NEGATIVE
Pigeon Serum Abs: NEGATIVE
Thermoact. Saccharii: NEGATIVE
Thermoactinomyces vulgaris, IgG: NEGATIVE

## 2022-07-27 LAB — ANA+ENA+DNA/DS+SCL 70+SJOSSA/B
ANA Titer 1: NEGATIVE
ENA RNP Ab: <0.2 AI (ref 0.0–0.9)
ENA SM Ab Ser-aCnc: <0.2 AI (ref 0.0–0.9)
ENA SSA (RO) Ab: <0.2 AI (ref 0.0–0.9)
ENA SSB (LA) Ab: <0.2 AI (ref 0.0–0.9)
Scleroderma (Scl-70) (ENA) Antibody, IgG: <0.2 AI (ref 0.0–0.9)
dsDNA Ab: <1 IU/mL (ref 0–9)

## 2022-07-29 LAB — QUANTIFERON-TB GOLD PLUS
Mitogen-NIL: <0 IU/mL
NIL: 0.09 IU/mL
QuantiFERON-TB Gold Plus: UNDETERMINED — AB
TB1-NIL: <0 IU/mL
TB2-NIL: 0 IU/mL

## 2022-07-29 LAB — CYCLIC CITRUL PEPTIDE ANTIBODY, IGG: Cyclic Citrullin Peptide Ab: <16 UNITS

## 2022-07-29 LAB — RHEUMATOID FACTOR: Rheumatoid fact SerPl-aCnc: 23 IU/mL — ABNORMAL HIGH (ref <14)

## 2022-07-31 NOTE — Telephone Encounter (Signed)
LVM for pt to call back to schedule   [employer] fed Josem Kaufmann: [phone] (exp. 07/27/22 to 08/25/22)

## 2022-08-07 NOTE — Telephone Encounter (Signed)
Patient called back, she is not going to do it at this time because that is not the issue that she is having.

## 2022-08-07 NOTE — Telephone Encounter (Signed)
LVM for pt to call back to schedule.

## 2022-08-30 ENCOUNTER — Encounter: Payer: Self-pay | Admitting: Internal Medicine

## 2022-08-30 ENCOUNTER — Ambulatory Visit: Payer: Federal, State, Local not specified - PPO | Attending: Internal Medicine | Admitting: Internal Medicine

## 2022-08-30 VITALS — BP 112/74 | HR 73 | Ht 70.0 in | Wt 180.2 lb

## 2022-08-30 DIAGNOSIS — I959 Hypotension, unspecified: Secondary | ICD-10-CM | POA: Diagnosis not present

## 2022-08-30 DIAGNOSIS — I480 Paroxysmal atrial fibrillation: Secondary | ICD-10-CM

## 2022-08-30 NOTE — Progress Notes (Signed)
HPI Ms. Ohmer returns today for followup. She is a pleasant 71 yo woman with a h/o HTN, PAF, and obesity. She has had minimal arrhythmia since her last visit. She has had a DVT and has been on xarelto. She has minimal palpitations and has been on flecainide. She notes mild orthostasis  Allergies  Allergen Reactions   Penicillins Rash     Current Outpatient Medications  Medication Sig Dispense Refill   acetaminophen (TYLENOL) 500 MG tablet Take 500 mg by mouth every 6 (six) hours as needed (pain).      albuterol (VENTOLIN HFA) 108 (90 Base) MCG/ACT inhaler Inhale 2 puffs into the lungs every 6 (six) hours as needed for wheezing or shortness of breath. 8 g 0   Cholecalciferol (VITAMIN D3) 1.25 MG (50000 UT) CAPS Take 1 capsule by mouth once a week.     Coenzyme Q10 (CO Q-10) 400 MG CAPS Take 1 tablet by mouth daily.     escitalopram (LEXAPRO) 10 MG tablet TAKE 1 TABLET BY MOUTH EVERY DAY 90 tablet 0   Ferrous Sulfate (IRON SLOW RELEASE) 142 (45 Fe) MG TBCR Take 1 tablet by mouth daily.     flecainide (TAMBOCOR) 100 MG tablet TAKE 1 TABLET BY MOUTH TWICE DAILY 180 tablet 2   fluticasone (FLONASE) 50 MCG/ACT nasal spray INSTILL TWO SPRAYS into IN EACH NOSTRIL DAILY 16 g 6   LORATADINE PO Take 1 tablet by mouth daily.     Multiple Vitamins-Minerals (MULTIVITAMIN WITH MINERALS) tablet Take 1 tablet by mouth daily.     Omega-3 1000 MG CAPS Take by mouth.     Probiotic Product (PROBIOTIC PO) Take 1 tablet by mouth daily.     rivaroxaban (XARELTO) 20 MG TABS tablet Take 1 tablet (20 mg total) by mouth daily with supper. Place on file 90 tablet 3   rosuvastatin (CRESTOR) 20 MG tablet Take 1 tablet (20 mg total) by mouth every other day. To replace pravastatin 45 tablet 3   zolpidem (AMBIEN) 5 MG tablet Take 1 tablet (5 mg total) by mouth at bedtime as needed for sleep. Put on file 30 tablet 5   No current facility-administered medications for this visit.     Past Medical History:   Diagnosis Date   HTN (hypertension)    (patient denies)   Neck pain    Paroxysmal A-fib (HCC)    CHADS2vasc =1   PONV (postoperative nausea and vomiting)    and had a siezure once at dentist    Typical atrial flutter (HCC)    Vasovagal syncope 04/28/13    ROS:   All systems reviewed and negative except as noted in the HPI.   Past Surgical History:  Procedure Laterality Date   CARDIOVERSION N/A 04/22/2015   Procedure: CARDIOVERSION;  Surgeon: Quintella Reichert, MD;  Location: [facility];  Service: Cardiovascular;  Laterality: N/A;   KNEE SURGERY     TEE WITHOUT CARDIOVERSION N/A 04/22/2015   Procedure: TRANSESOPHAGEAL ECHOCARDIOGRAM (TEE);  Surgeon: Quintella Reichert, MD;  Location: [facility];  Service: Cardiovascular;  Laterality: N/A;     Family History  Problem Relation Age of Onset   Hodgkin's lymphoma Mother    Heart attack Father 33   Breast cancer Paternal Aunt    Diabetes Maternal Grandfather    Sleep apnea Neg Hx      Social History   Socioeconomic History   Marital status: Widowed    Spouse name: Not on file   Number  of children: Not on file   Years of education: Not on file   Highest education level: Not on file  Occupational History   Not on file  Tobacco Use   Smoking status: Never   Smokeless tobacco: Never  Vaping Use   Vaping Use: Never used  Substance and Sexual Activity   Alcohol use: Yes    Comment: occasionally   Drug use: No   Sexual activity: Not on file  Other Topics Concern   Not on file  Social History Narrative   Not on file   Social Determinants of Health   Financial Resource Strain: Not on file  Food Insecurity: Not on file  Transportation Needs: Not on file  Physical Activity: Not on file  Stress: Not on file  Social Connections: Not on file  Intimate Partner Violence: Not on file     BP 112/74   Pulse 73   Ht 5\' 10"  (1.778 m)   Wt 180 lb 3.2 oz (81.7 kg)   SpO2 98%   BMI 25.86 kg/m   Physical Exam:  Well  appearing 71 yo  woman, NAD HEENT: Unremarkable Neck:  No JVD, no thyromegally Lymphatics:  No adenopathy Back:  No CVA tenderness Lungs:  Clear with no wheezes HEART:  Regular rate rhythm, no murmurs, no rubs, no clicks Abd:  soft, positive bowel sounds, no organomegally, no rebound, no guarding Ext:  2 plus pulses, no edema, no cyanosis, no clubbing Skin:  No rashes no nodules Neuro:  CN II through XII intact, motor grossly intact  EKG - NSR   Assess/Plan: 1. PAF - she is maintaining NSR very nicely. She will continue flecainide but she is off of her beta blocker. 2. Obesity - she has lost 14 lbs. Since her last visit.  On [place] 3. Hypotension - her bp is well controlled. No change in meds. I encouraged her to eat more salt if she has orthostasis. 4. Coags -she has not had any bleeding on xarelto.   Carleene Overlie Zurri Rudden,MD

## 2022-08-30 NOTE — Patient Instructions (Signed)

## 2022-10-25 ENCOUNTER — Other Ambulatory Visit: Payer: Self-pay | Admitting: Family Medicine

## 2022-10-25 DIAGNOSIS — F5101 Primary insomnia: Secondary | ICD-10-CM

## 2022-10-25 DIAGNOSIS — R4589 Other symptoms and signs involving emotional state: Secondary | ICD-10-CM

## 2022-10-26 ENCOUNTER — Ambulatory Visit: Payer: Federal, State, Local not specified - PPO | Admitting: Family Medicine

## 2022-11-02 ENCOUNTER — Other Ambulatory Visit: Payer: Self-pay | Admitting: Family Medicine

## 2022-11-02 ENCOUNTER — Ambulatory Visit: Payer: Federal, State, Local not specified - PPO | Admitting: Internal Medicine

## 2022-11-02 ENCOUNTER — Encounter: Payer: Self-pay | Admitting: Nurse Practitioner

## 2022-11-02 ENCOUNTER — Other Ambulatory Visit (INDEPENDENT_AMBULATORY_CARE_PROVIDER_SITE_OTHER): Payer: Federal, State, Local not specified - PPO

## 2022-11-02 ENCOUNTER — Ambulatory Visit (INDEPENDENT_AMBULATORY_CARE_PROVIDER_SITE_OTHER): Payer: Federal, State, Local not specified - PPO | Admitting: Nurse Practitioner

## 2022-11-02 VITALS — BP 108/64 | HR 74 | Temp 98.2°F | Ht 70.0 in | Wt 187.0 lb

## 2022-11-02 DIAGNOSIS — J849 Interstitial pulmonary disease, unspecified: Secondary | ICD-10-CM

## 2022-11-02 DIAGNOSIS — D509 Iron deficiency anemia, unspecified: Secondary | ICD-10-CM | POA: Diagnosis not present

## 2022-11-02 DIAGNOSIS — F5101 Primary insomnia: Secondary | ICD-10-CM

## 2022-11-02 DIAGNOSIS — R0609 Other forms of dyspnea: Secondary | ICD-10-CM | POA: Diagnosis not present

## 2022-11-02 DIAGNOSIS — M0580 Other rheumatoid arthritis with rheumatoid factor of unspecified site: Secondary | ICD-10-CM | POA: Insufficient documentation

## 2022-11-02 LAB — PULMONARY FUNCTION TEST
DL/VA % pred: 100 %
DL/VA: 3.96 ml/min/mmHg/L
DLCO cor % pred: 74 %
DLCO cor: 17.55 ml/min/mmHg
DLCO unc % pred: 74 %
DLCO unc: 17.55 ml/min/mmHg
FEF 25-75 Post: 2.47 L/sec
FEF 25-75 Pre: 2 L/sec
FEF2575-%Change-Post: 23 %
FEF2575-%Pred-Post: 111 %
FEF2575-%Pred-Pre: 90 %
FEV1-%Change-Post: 4 %
FEV1-%Pred-Post: 87 %
FEV1-%Pred-Pre: 83 %
FEV1-Post: 2.47 L
FEV1-Pre: 2.36 L
FEV1FVC-%Change-Post: 3 %
FEV1FVC-%Pred-Pre: 102 %
FEV6-%Change-Post: 1 %
FEV6-%Pred-Post: 86 %
FEV6-%Pred-Pre: 85 %
FEV6-Post: 3.08 L
FEV6-Pre: 3.04 L
FEV6FVC-%Pred-Post: 104 %
FEV6FVC-%Pred-Pre: 104 %
FVC-%Change-Post: 1 %
FVC-%Pred-Post: 82 %
FVC-%Pred-Pre: 81 %
FVC-Post: 3.08 L
FVC-Pre: 3.04 L
Post FEV1/FVC ratio: 80 %
Post FEV6/FVC ratio: 100 %
Pre FEV1/FVC ratio: 77 %
Pre FEV6/FVC Ratio: 100 %
RV % pred: 115 %
RV: 2.89 L
TLC % pred: 95 %
TLC: 5.69 L

## 2022-11-02 LAB — CBC WITH DIFFERENTIAL/PLATELET
Basophils Absolute: 0.1 10*3/uL (ref 0.0–0.1)
Basophils Relative: 0.6 % (ref 0.0–3.0)
Eosinophils Absolute: 0.1 10*3/uL (ref 0.0–0.7)
Eosinophils Relative: 0.7 % (ref 0.0–5.0)
HCT: 40 % (ref 36.0–46.0)
Hemoglobin: 13.2 g/dL (ref 12.0–15.0)
Lymphocytes Relative: 46.1 % — ABNORMAL HIGH (ref 12.0–46.0)
Lymphs Abs: 4.6 10*3/uL — ABNORMAL HIGH (ref 0.7–4.0)
MCHC: 32.9 g/dL (ref 30.0–36.0)
MCV: 87.2 fl (ref 78.0–100.0)
Monocytes Absolute: 0.8 10*3/uL (ref 0.1–1.0)
Monocytes Relative: 7.6 % (ref 3.0–12.0)
Neutro Abs: 4.5 10*3/uL (ref 1.4–7.7)
Neutrophils Relative %: 45 % (ref 43.0–77.0)
Platelets: 216 10*3/uL (ref 150.0–400.0)
RBC: 4.59 Mil/uL (ref 3.87–5.11)
RDW: 15.4 % (ref 11.5–15.5)
WBC: 10 10*3/uL (ref 4.0–10.5)

## 2022-11-02 NOTE — Assessment & Plan Note (Signed)
Referral to rheumatology. See above.

## 2022-11-02 NOTE — Patient Instructions (Signed)
Full PFT performed today. °

## 2022-11-02 NOTE — Assessment & Plan Note (Signed)
Symptomatic due to anemia. She has been treated with iron supplementation. See above.

## 2022-11-02 NOTE — Progress Notes (Signed)
@Patient  ID: Heather Keith, female    DOB: 1951/08/05, 72 y.o.   MRN: [id number]  Chief Complaint  Patient presents with   Follow-up    Pft results. Pt states no new issues since LOV.    Referring provider: Janora Norlander, DO  HPI: 72 year old female, never smoker followed for DOE and potential ILD. She is a patient of Dr. Golden Pop and last seen in office 07/24/2022. Past medical history significant for HTN, a fib on xarelto, HLD, IDA, osteoporosis.   TEST/EVENTS:  06/14/2022 CT chest: atherosclerosis. Stable moderate hiatal hernia. Scattered subcm partially calcified pulmonary nodules, consistent with benign postinflammatory or postinfectious change given stability. Progressive subpleural scarring with mild basilar reticular opacities, possibly due to atelectasis but unable to exclude ILD. 07/24/2022: indeterminate Quant TB gold; rheumatoid factor 23 11/02/2022 PFT: FVC 81, FEV1 83, ratio 80, TLC 95, DLCOunc 74. No BD  07/24/2022: OV with Dr. Chase Caller for initial consult. First cousin of late Mr. Kennon Rounds, previous patient who died from pulmonary fibrosis secondary to chronic hypersensitivity pneumonitis. She had a CT chest in November 2021 which ILD was not reported on. She had stable lung nodules. Review of this scan with basal infiltrates but thoracic radiologist felt it was more consistent with atelectasis. Then in January 2022, she had COVID. Possibly sometime after, she started noticing SOB for the last year and a half. In summer 2022, she had cardiopulmonary stress test and echo, which were normal. She did have a recent diagnosis of anemia, started on iron and since then SOB is better. She has had cough since July 2023; getting better. Her SOB is associated with dizziness. Gets better when she lies down. Possible ILD. Will have thoracic radiologist review CT. Autoimmune panel. Full PFT ordered.   11/02/2022: Today - follow up Patient presents today for follow up after PFTs. After  her last visit, thoracic radiologist reviewed her imaging and was unsure if she actually had ILD. She does have risk factors so was encouraged to continue with workup and plan for HRCT in the summer 2024. She did complete her autoimmune panel workup. She has not heard results from these. Quantiferon TB was indeterminate. She denies any known exposure to TB, illicit drug use, close quarter living, or travel to areas with high rates of TB. She denies night sweats, weight loss, fevers, hemoptysis. She also had a positive rheumatoid factor. She does tell me she has joint pains and occasionally swelling/redness. Mainly right knee, both shoulders, and left thumb. She does not think she has ever had workup for RA. She has had joint injections to her thumb before, which helps. 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 Regarding her breathing, she tells me that she actually feels better since she was here last. She has been undergoing treatment for her anemia and feels like this has made a huge difference. She has been playing pickle ball and exercising without difficulty. This is usually the only time she notices her shortness of breath and feels like it's relatively normal for the strenuous activity she's doing. She still has an occasional dry cough and throat clearing; attributes this to postnasal drainage. Otherwise aside from her joint pains, she is feeling well.  Her pulmonary function testing today revealed normal spirometry and lung volumes. She did have a moderate diffusion defect but this was not corrected for hgb. She has not had recent CBC drawn.   Allergies  Allergen Reactions   Penicillins Rash  Immunization History  Administered Date(s) Administered   Fluad Quad(high Dose 65+) 07/06/2022   Influenza Whole 07/02/2015   Influenza, High Dose Seasonal PF 07/09/2018, 07/15/2019   Influenza-Unspecified 07/13/2020   Pneumococcal Conjugate-13 07/05/2016   Pneumococcal  Polysaccharide-23 10/17/2015, 07/05/2017   Tdap 12/22/1998, 10/17/2015, 04/18/2020   Zoster Recombinat (Shingrix) 07/09/2018, 10/07/2018    Past Medical History:  Diagnosis Date   HTN (hypertension)    (patient denies)   Neck pain    Paroxysmal A-fib (HCC)    CHADS2vasc =1   PONV (postoperative nausea and vomiting)    and had a siezure once at dentist    Typical atrial flutter (HCC)    Vasovagal syncope 04/28/13    Tobacco History: Social History   Tobacco Use  Smoking Status Never  Smokeless Tobacco Never   Counseling given: Not Answered   Outpatient Medications Prior to Visit  Medication Sig Dispense Refill   acetaminophen (TYLENOL) 500 MG tablet Take 500 mg by mouth every 6 (six) hours as needed (pain).      albuterol (VENTOLIN HFA) 108 (90 Base) MCG/ACT inhaler Inhale 2 puffs into the lungs every 6 (six) hours as needed for wheezing or shortness of breath. 8 g 0   Cholecalciferol (VITAMIN D3) 1.25 MG (50000 UT) CAPS Take 1 capsule by mouth once a week.     Coenzyme Q10 (CO Q-10) 400 MG CAPS Take 1 tablet by mouth daily.     escitalopram (LEXAPRO) 10 MG tablet TAKE 1 TABLET BY MOUTH EVERY DAY 90 tablet 0   Ferrous Sulfate (IRON SLOW RELEASE) 142 (45 Fe) MG TBCR Take 1 tablet by mouth daily.     flecainide (TAMBOCOR) 100 MG tablet TAKE 1 TABLET BY MOUTH TWICE DAILY 180 tablet 2   fluticasone (FLONASE) 50 MCG/ACT nasal spray INSTILL TWO SPRAYS into IN EACH NOSTRIL DAILY 16 g 6   LORATADINE PO Take 1 tablet by mouth daily.     Multiple Vitamins-Minerals (MULTIVITAMIN WITH MINERALS) tablet Take 1 tablet by mouth daily.     Omega-3 1000 MG CAPS Take by mouth.     Probiotic Product (PROBIOTIC PO) Take 1 tablet by mouth daily.     rivaroxaban (XARELTO) 20 MG TABS tablet Take 1 tablet (20 mg total) by mouth daily with supper. Place on file 90 tablet 3   rosuvastatin (CRESTOR) 20 MG tablet Take 1 tablet (20 mg total) by mouth every other day. To replace pravastatin 45 tablet 3    zolpidem (AMBIEN) 5 MG tablet Take 1 tablet (5 mg total) by mouth at bedtime as needed for sleep. Put on file 30 tablet 5   No facility-administered medications prior to visit.     Review of Systems:   Constitutional: No weight loss or gain, night sweats, fevers, chills, fatigue, or lassitude. HEENT: No headaches, difficulty swallowing, tooth/dental problems, or sore throat. No sneezing, itching, ear ache, nasal congestion. +post nasal drip CV:  No chest pain, orthopnea, PND, swelling in lower extremities, anasarca, dizziness, palpitations, syncope Resp: +shortness of breath with strenuous exertion only; occasional cough. No excess mucus or change in color of mucus. No hemoptysis. No wheezing.  No chest wall deformity GI:  No heartburn, indigestion, abdominal pain, nausea, vomiting, diarrhea, change in bowel habits, loss of appetite, bloody stools.  GU: No dysuria, change in color of urine, urgency or frequency.   Skin: No rash, lesions, ulcerations MSK:  No joint pain or swelling.   Neuro: No dizziness or lightheadedness.  Psych: No depression or anxiety.  Mood stable.     Physical Exam:  BP 108/64 (BP Location: Right Arm, Patient Position: Sitting, Cuff Size: Normal)   Pulse 74   Temp 98.2 F (36.8 C) (Oral)   Ht 5\' 10"  (1.778 m)   Wt 187 lb (84.8 kg)   SpO2 97%   BMI 26.83 kg/m   GEN: Pleasant, interactive, well-appearing; in no acute distress. HEENT:  Normocephalic and atraumatic. PERRLA. Sclera white. Nasal turbinates pink, moist and patent bilaterally. No rhinorrhea present. Oropharynx pink and moist, without exudate or edema. No lesions, ulcerations, or postnasal drip.  NECK:  Supple w/ fair ROM. No JVD present. Normal carotid impulses w/o bruits. Thyroid symmetrical with no goiter or nodules palpated. No lymphadenopathy.   CV: RRR, no m/r/g, no peripheral edema. Pulses intact, +2 bilaterally. No cyanosis, pallor or clubbing. PULMONARY:  Unlabored, regular breathing. Clear  bilaterally A&P w/o wheezes/rales/rhonchi. No accessory muscle use.  GI: BS present and normoactive. Soft, non-tender to palpation. No organomegaly or masses detected.  MSK: No erythema, warmth or tenderness. Cap refil <2 sec all extrem. No deformities or joint swelling noted.  Neuro: A/Ox3. No focal deficits noted.   Skin: Warm, no lesions or rashe Psych: Normal affect and behavior. Judgement and thought content appropriate.     Lab Results:  CBC    Component Value Date/Time   WBC 10.0 11/02/2022 1533   RBC 4.59 11/02/2022 1533   HGB 13.2 11/02/2022 1533   HGB 10.9 (L) 07/03/2022 1428   HCT 40.0 11/02/2022 1533   HCT 34.0 07/03/2022 1428   PLT 216.0 11/02/2022 1533   PLT 248 07/03/2022 1428   MCV 87.2 11/02/2022 1533   MCV 84 07/03/2022 1428   MCH 26.9 07/03/2022 1428   MCHC 32.9 11/02/2022 1533   RDW 15.4 11/02/2022 1533   RDW 14.3 07/03/2022 1428   LYMPHSABS 4.6 (H) 11/02/2022 1533   LYMPHSABS 2.4 03/20/2022 1419   MONOABS 0.8 11/02/2022 1533   EOSABS 0.1 11/02/2022 1533   EOSABS 0.1 03/20/2022 1419   BASOSABS 0.1 11/02/2022 1533   BASOSABS 0.1 03/20/2022 1419    BMET    Component Value Date/Time   NA 139 04/05/2022 1544   K 4.7 04/05/2022 1544   CL 102 04/05/2022 1544   CO2 22 04/05/2022 1544   GLUCOSE 87 04/05/2022 1544   GLUCOSE 86 04/20/2015 1559   BUN 11 04/05/2022 1544   CREATININE 0.83 04/05/2022 1544   CALCIUM 9.8 04/05/2022 1544   GFRNONAA 75 01/15/2020 0959   GFRAA 87 01/15/2020 0959    BNP No results found for: "BNP"   Imaging:  No results found.       Latest Ref Rng & Units 11/02/2022    1:32 PM  PFT Results  FVC-Pre L 3.04  P  FVC-Predicted Pre % 81  P  FVC-Post L 3.08  P  FVC-Predicted Post % 82  P  Pre FEV1/FVC % % 77  P  Post FEV1/FCV % % 80  P  FEV1-Pre L 2.36  P  FEV1-Predicted Pre % 83  P  FEV1-Post L 2.47  P  DLCO uncorrected ml/min/mmHg 17.55  P  DLCO UNC% % 74  P  DLCO corrected ml/min/mmHg 17.55  P  DLCO COR  %Predicted % 74  P  DLVA Predicted % 100  P  TLC L 5.69  P  TLC % Predicted % 95  P  RV % Predicted % 115  P    P Preliminary result    No results  found for: "NITRICOXIDE"      Assessment & Plan:   DOE (dyspnea on exertion) Unclear if there is a true component of ILD contributing to prior symptoms. Her DOE seemed to correlate to her anemia, as symptoms have improved with treatment. She does have risk factors for ILD though. Rheumatoid factor was elevated and she does have polyarthritis so we will refer her to rheumatology for further evaluation. We did discuss that RA can lead to ILD. Her PFTs did not show any obstruction or restriction. She did have a moderate diffusion defect; however, this was not corrected for hgb as she doesn't have recent CBC. We will recheck this today. Given the improvement in her respiratory symptoms, we will plan to move forward with monitoring and obtain HRCT chest in summer 2024.   Patient Instructions  Glad your breathing is better with treatment of your anemia. We will recheck your labs today  Your previous TB test was indeterminate so we need to redraw this today.  I'll call you with results once I have these.   High resolution CT chest around July 2024  Referral to rheumatology given your positive rheumatoid factor and joint paints  Follow up after HRCT chest with Dr. Chase Caller end of July. If symptoms do not improve or worsen, please contact office for sooner follow up or seek emergency care.    ILD (interstitial lung disease) ([place]) Not a confirmed diagnosis. See above. She did also have indeterminate Quantiferon TB gold - no know exposure to TB and not high risk. We will redraw this today.   IDA (iron deficiency anemia) Symptomatic due to anemia. She has been treated with iron supplementation. See above.   Polyarthritis with positive rheumatoid factor (HCC) Referral to rheumatology. See above.    I spent 35 minutes of dedicated to the care  of this patient on the date of this encounter to include pre-visit review of records, face-to-face time with the patient discussing conditions above, post visit ordering of testing, clinical documentation with the electronic health record, making appropriate referrals as documented, and communicating necessary findings to members of the patients care team.  Clayton Bibles, NP 11/02/2022  Pt aware and understands NP's role.

## 2022-11-02 NOTE — Progress Notes (Signed)
Full PFT performed today, 

## 2022-11-02 NOTE — Assessment & Plan Note (Signed)
Unclear if there is a true component of ILD contributing to prior symptoms. Her DOE seemed to correlate to her anemia, as symptoms have improved with treatment. She does have risk factors for ILD though. Rheumatoid factor was elevated and she does have polyarthritis so we will refer her to rheumatology for further evaluation. We did discuss that RA can lead to ILD. Her PFTs did not show any obstruction or restriction. She did have a moderate diffusion defect; however, this was not corrected for hgb as she doesn't have recent CBC. We will recheck this today. Given the improvement in her respiratory symptoms, we will plan to move forward with monitoring and obtain HRCT chest in summer 2024.   Patient Instructions  Glad your breathing is better with treatment of your anemia. We will recheck your labs today  Your previous TB test was indeterminate so we need to redraw this today.  I'll call you with results once I have these.   High resolution CT chest around July 2024  Referral to rheumatology given your positive rheumatoid factor and joint paints  Follow up after HRCT chest with Dr. Chase Caller end of July. If symptoms do not improve or worsen, please contact office for sooner follow up or seek emergency care.

## 2022-11-02 NOTE — Assessment & Plan Note (Signed)
Not a confirmed diagnosis. See above. She did also have indeterminate Quantiferon TB gold - no know exposure to TB and not high risk. We will redraw this today.

## 2022-11-02 NOTE — Patient Instructions (Addendum)
Glad your breathing is better with treatment of your anemia. We will recheck your labs today  Your previous TB test was indeterminate so we need to redraw this today.  I'll call you with results once I have these.   High resolution CT chest around July 2024  Referral to rheumatology given your positive rheumatoid factor and joint paints  Follow up after HRCT chest with Dr. Chase Caller end of July. If symptoms do not improve or worsen, please contact office for sooner follow up or seek emergency care.

## 2022-11-04 LAB — QUANTIFERON-TB GOLD PLUS
Mitogen-NIL: 0.4 IU/mL
NIL: 0.02 IU/mL
QuantiFERON-TB Gold Plus: UNDETERMINED — AB
TB1-NIL: 0 IU/mL
TB2-NIL: 0 IU/mL

## 2022-11-06 ENCOUNTER — Telehealth: Payer: Self-pay | Admitting: Nurse Practitioner

## 2022-11-07 ENCOUNTER — Encounter: Payer: Self-pay | Admitting: Family Medicine

## 2022-11-21 ENCOUNTER — Other Ambulatory Visit: Payer: Self-pay | Admitting: Family Medicine

## 2022-11-21 DIAGNOSIS — E782 Mixed hyperlipidemia: Secondary | ICD-10-CM

## 2022-12-05 NOTE — Telephone Encounter (Signed)
Next step is to get a PPD skin test. Typcally publ[facility] health dept or PCP Janora Norlander, DO but  reco you check with PCP if they do it. Doubt it and might just have to go to public health dept

## 2022-12-06 NOTE — Telephone Encounter (Signed)
Holly, please have pt contact her PCP's office to see if they perform PPD skin test for TB. She had an indeterminate Quant Gold on repeat testing. No symptoms or imaging consistent with active infection. If her PCP office does not do these, we will have her follow up with the public health department. Thanks!

## 2022-12-07 ENCOUNTER — Telehealth: Payer: Self-pay | Admitting: Family Medicine

## 2022-12-07 NOTE — Telephone Encounter (Signed)
ATC pt LVM w/ Katie's message (ok per DPR), I called and spoke w/ Vikki Ports at pts PCP office she reports that their office does do PPD testing. Chelsea took pts name & DOB to let PCP know.

## 2022-12-07 NOTE — Telephone Encounter (Signed)
[facility] called to let us know if patient calls and asks about doing PPD for TB testing, she does need to have that done. Patient already had Quantum Gold test done.

## 2022-12-12 ENCOUNTER — Encounter: Payer: Self-pay | Admitting: Family Medicine

## 2022-12-12 ENCOUNTER — Ambulatory Visit: Payer: Federal, State, Local not specified - PPO | Admitting: Family Medicine

## 2022-12-12 VITALS — BP 112/65 | HR 66 | Temp 98.7°F | Ht 70.0 in | Wt 187.0 lb

## 2022-12-12 DIAGNOSIS — M0580 Other rheumatoid arthritis with rheumatoid factor of unspecified site: Secondary | ICD-10-CM

## 2022-12-12 DIAGNOSIS — Z79899 Other long term (current) drug therapy: Secondary | ICD-10-CM

## 2022-12-12 DIAGNOSIS — Z7901 Long term (current) use of anticoagulants: Secondary | ICD-10-CM | POA: Diagnosis not present

## 2022-12-12 DIAGNOSIS — Z111 Encounter for screening for respiratory tuberculosis: Secondary | ICD-10-CM | POA: Diagnosis not present

## 2022-12-12 DIAGNOSIS — M85832 Other specified disorders of bone density and structure, left forearm: Secondary | ICD-10-CM

## 2022-12-12 DIAGNOSIS — F5101 Primary insomnia: Secondary | ICD-10-CM

## 2022-12-12 MED ORDER — ZOLPIDEM TARTRATE 5 MG PO TABS: 5.0000 mg | ORAL_TABLET | Freq: Every evening | ORAL | 5 refills | Status: DC | PRN

## 2022-12-12 NOTE — Progress Notes (Signed)
Subjective: CC: Insomnia PCP: Janora Norlander, DO [id number] Heather Keith is a 72 y.o. female presenting to clinic today for:  1.  Insomnia/atrial fibrillation Patient here for interval refill on Ambien.  She does not sleep well without the medication.  She tried melatonin but this was not helpful.  Denies any excessive daytime sedation.  Overall she has been doing relatively well.  She is slowly getting back to her normal activities.  Breathing has been good.  She has had no runs of atrial fibrillation.  She is compliant with Xarelto, Tambocor as directed.  2.  Overweight associate with hyperlipidemia She is compliant with her Crestor and unfortunately insurance will not continue to pay for semaglutide injected so she wants to pursue compound .  She would like her hemoglobin and vitamin D rechecked given anemia and recent abnormalities.  3.  Interstitial lung disease Patient is under the care of pulmonology.  She apparently had 2 QuantiFERON gold tests which were indeterminate both times.  Would like to get a skin TB test today.  No known exposure to tuberculosis.  Breathing has been stable.  She is going to see a rheumatologist soon for positive rheumatoid factor.  Really does not suffer from inflammatory arthritis of the hands, wrists or elbows but does have some chronic knee issues.  ROS: Per HPI  Allergies  Allergen Reactions   Penicillins Rash   Past Medical History:  Diagnosis Date   HTN (hypertension)    (patient denies)   Neck pain    Paroxysmal A-fib ([place])    CHADS2vasc =1   PONV (postoperative nausea and vomiting)    and had a siezure once at dentist    Typical atrial flutter ([place])    Vasovagal syncope 04/28/13    Current Outpatient Medications:    acetaminophen (TYLENOL) 500 MG tablet, Take 500 mg by mouth every 6 (six) hours as needed (pain). , Disp: , Rfl:    albuterol (VENTOLIN HFA) 108 (90 Base) MCG/ACT inhaler, Inhale 2 puffs into the lungs every 6 (six) hours  as needed for wheezing or shortness of breath., Disp: 8 g, Rfl: 0   Cholecalciferol (VITAMIN D3) 1.25 MG (50000 UT) CAPS, Take 1 capsule by mouth once a week., Disp: , Rfl:    Coenzyme Q10 (CO Q-10) 400 MG CAPS, Take 1 tablet by mouth daily., Disp: , Rfl:    escitalopram (LEXAPRO) 10 MG tablet, TAKE 1 TABLET BY MOUTH EVERY DAY, Disp: 90 tablet, Rfl: 0   Ferrous Sulfate (IRON SLOW RELEASE) 142 (45 Fe) MG TBCR, Take 1 tablet by mouth daily., Disp: , Rfl:    flecainide (TAMBOCOR) 100 MG tablet, TAKE 1 TABLET BY MOUTH TWICE DAILY, Disp: 180 tablet, Rfl: 2   fluticasone (FLONASE) 50 MCG/ACT nasal spray, INSTILL TWO SPRAYS into IN EACH NOSTRIL DAILY, Disp: 16 g, Rfl: 6   LORATADINE PO, Take 1 tablet by mouth daily., Disp: , Rfl:    Multiple Vitamins-Minerals (MULTIVITAMIN WITH MINERALS) tablet, Take 1 tablet by mouth daily., Disp: , Rfl:    Omega-3 1000 MG CAPS, Take by mouth., Disp: , Rfl:    Probiotic Product (PROBIOTIC PO), Take 1 tablet by mouth daily., Disp: , Rfl:    rivaroxaban (XARELTO) 20 MG TABS tablet, Take 1 tablet (20 mg total) by mouth daily with supper. Place on file, Disp: 90 tablet, Rfl: 3   rosuvastatin (CRESTOR) 20 MG tablet, TAKE 1 TABLET BY MOUTH EVERY OTHER DAY, Disp: 45 tablet, Rfl: 0   zolpidem (AMBIEN)  5 MG tablet, Take 1 tablet (5 mg total) by mouth at bedtime as needed for sleep. Put on file, Disp: 30 tablet, Rfl: 5 Social History   Socioeconomic History   Marital status: Widowed    Spouse name: Not on file   Number of children: Not on file   Years of education: Not on file   Highest education level: Not on file  Occupational History   Not on file  Tobacco Use   Smoking status: Never   Smokeless tobacco: Never  Vaping Use   Vaping Use: Never used  Substance and Sexual Activity   Alcohol use: Yes    Comment: occasionally   Drug use: No   Sexual activity: Not on file  Other Topics Concern   Not on file  Social History Narrative   Not on file   Social  Determinants of Health   Financial Resource Strain: Not on file  Food Insecurity: Not on file  Transportation Needs: Not on file  Physical Activity: Not on file  Stress: Not on file  Social Connections: Not on file  Intimate Partner Violence: Not on file   Family History  Problem Relation Age of Onset   Hodgkin's lymphoma Mother    Heart attack Father 35   Breast cancer Paternal Aunt    Diabetes Maternal Grandfather    Sleep apnea Neg Hx     Objective: Office vital signs reviewed. BP 112/65   Pulse 66   Temp 98.7 F (37.1 C)   Ht '5\' 10"'$  (1.778 m)   Wt 187 lb (84.8 kg)   SpO2 96%   BMI 26.83 kg/m   Physical Examination:  General: Awake, alert, well nourished, No acute distress HEENT: sclera white, MMM Cardio: regular rate and rhythm, S1S2 heard, no murmurs appreciated Pulm: clear to auscultation bilaterally, no wheezes, rhonchi or rales; normal work of breathing on room air GI: soft, non-tender, non-distended, bowel sounds present x4, no hepatomegaly, no splenomegaly, no masses Extremities: warm, well perfused, No edema, cyanosis or clubbing; +2 pulses bilaterally MSK: normal gait and station Psych: Mood stable, speech normal, affect appropriate     12/12/2022   10:28 AM 06/12/2022    8:58 AM 04/24/2022    2:41 PM  Depression screen PHQ 2/9  Decreased Interest 0 0 1  Down, Depressed, Hopeless 0 0 0  PHQ - 2 Score 0 0 1  Altered sleeping 0 1 1  Tired, decreased energy 0 1 1  Change in appetite 0 0 0  Feeling bad or failure about yourself  0 0 0  Trouble concentrating 0 0 0  Moving slowly or fidgety/restless 0 0 0  Suicidal thoughts 0 0 0  PHQ-9 Score 0 2 3  Difficult doing work/chores Not difficult at all Not difficult at all Not difficult at all      12/12/2022   10:28 AM 06/12/2022    8:58 AM 04/24/2022    2:42 PM 12/11/2021    9:23 AM  GAD 7 : Generalized Anxiety Score  Nervous, Anxious, on Edge 0 0 0 0  Control/stop worrying 0 0 0 0  Worry too much -  different things 0 0 0 0  Trouble relaxing 0 0 0 0  Restless 0 0 0 0  Easily annoyed or irritable 0 0 0 0  Afraid - awful might happen 0 0 0 0  Total GAD 7 Score 0 0 0 0  Anxiety Difficulty Not difficult at all Not difficult at all Not difficult at  all Not difficult at all   Assessment/ Plan: 72 y.o. female   Primary insomnia - Plan: zolpidem (AMBIEN) 5 MG tablet, Drug Screen 10 W/Conf, Se  Controlled substance agreement signed - Plan: Drug Screen 10 W/Conf, Se  Chronic anticoagulation - Plan: CBC  Osteopenia of left forearm - Plan: VITAMIN D 25 Hydroxy (Vit-D Deficiency, Fractures)  Screening-pulmonary TB - Plan: PPD  Polyarthritis with positive rheumatoid factor ([place])  UDS and CSC were updated as per office policy.  National narcotic database reviewed and there were no red flags.  Ambien renewed  Check CBC and vitamin D given history of abnormalities.  TB skin test performed today as she has had inconclusive serum test x 2 now.  She is currently under the care of of pulmonology for interstitial lung disease.  I reviewed her rheumatoid factor which was positive.  Agree with referral to rheumatology for further evaluation and management  No orders of the defined types were placed in this encounter.  No orders of the defined types were placed in this encounter.    Janora Norlander, DO [place] [phone]

## 2022-12-14 ENCOUNTER — Ambulatory Visit: Payer: Federal, State, Local not specified - PPO

## 2022-12-14 DIAGNOSIS — Z111 Encounter for screening for respiratory tuberculosis: Secondary | ICD-10-CM

## 2022-12-14 LAB — TB SKIN TEST
Induration: 0 mm
TB Skin Test: NEGATIVE

## 2022-12-14 NOTE — Progress Notes (Signed)
Patient here today to have TB skin test read.  Results are negative and were placed in chart.

## 2022-12-16 LAB — DRUG SCREEN 10 W/CONF, SERUM
Amphetamines, IA: NEGATIVE ng/mL
Barbiturates, IA: NEGATIVE ug/mL
Benzodiazepines, IA: NEGATIVE ng/mL
Cocaine & Metabolite, IA: NEGATIVE ng/mL
Methadone, IA: NEGATIVE ng/mL
Opiates, IA: NEGATIVE ng/mL
Oxycodones, IA: NEGATIVE ng/mL
Phencyclidine, IA: NEGATIVE ng/mL
Propoxyphene, IA: NEGATIVE ng/mL
THC(Marijuana) Metabolite, IA: NEGATIVE ng/mL

## 2022-12-16 LAB — CBC
Hematocrit: 40.6 % (ref 34.0–46.6)
Hemoglobin: 13 g/dL (ref 11.1–15.9)
MCH: 29.1 pg (ref 26.6–33.0)
MCHC: 32 g/dL (ref 31.5–35.7)
MCV: 91 fL (ref 79–97)
Platelets: 248 10*3/uL (ref 150–450)
RBC: 4.47 x10E6/uL (ref 3.77–5.28)
RDW: 13.3 % (ref 11.7–15.4)
WBC: 6.6 10*3/uL (ref 3.4–10.8)

## 2022-12-16 LAB — VITAMIN D 25 HYDROXY (VIT D DEFICIENCY, FRACTURES): Vit D, 25-Hydroxy: 62.1 ng/mL (ref 30.0–100.0)

## 2022-12-21 ENCOUNTER — Other Ambulatory Visit: Payer: Self-pay

## 2023-01-15 ENCOUNTER — Encounter: Payer: Self-pay | Admitting: Internal Medicine

## 2023-01-15 ENCOUNTER — Ambulatory Visit: Payer: Federal, State, Local not specified - PPO | Attending: Internal Medicine | Admitting: Internal Medicine

## 2023-01-15 VITALS — BP 107/68 | HR 71 | Resp 14 | Ht 67.75 in | Wt 191.0 lb

## 2023-01-15 DIAGNOSIS — M0580 Other rheumatoid arthritis with rheumatoid factor of unspecified site: Secondary | ICD-10-CM

## 2023-01-15 DIAGNOSIS — R918 Other nonspecific abnormal finding of lung field: Secondary | ICD-10-CM | POA: Diagnosis not present

## 2023-01-15 DIAGNOSIS — J849 Interstitial pulmonary disease, unspecified: Secondary | ICD-10-CM | POA: Diagnosis not present

## 2023-01-15 DIAGNOSIS — I480 Paroxysmal atrial fibrillation: Secondary | ICD-10-CM | POA: Diagnosis not present

## 2023-01-15 DIAGNOSIS — R899 Unspecified abnormal finding in specimens from other organs, systems and tissues: Secondary | ICD-10-CM

## 2023-01-15 NOTE — Progress Notes (Signed)
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 Patient states her back is not in good shape. )   History of Present Illness: Heather Keith is a 72 y.o. female her for evaluation of positive RF and joint pains checked with radiographic lung changes on CT scan but functionally unclear if having progressive ILD process.  She started having symptoms of dyspnea on exertion after illness with COVID in early 2022.  That disease course was complicated by a DVT in the left leg.  Subsequently has also noticed increase in dry mouth symptoms.  These are still ongoing not associated with any particular ulcers dental complications or palpable swelling in the face and neck.  She has a history of arthritis including previous seeing Dr. Kellie Simmering for [facility] joint injections and thumb braces.  She had previous right knee arthroscopy in 2009.  She also has chronic back pain at multiple areas without any previous surgery.  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  Has short period of morning stiffness half an hour or less daily.  Does not notice localized joint swelling erythema or warmth.  She is not taking any medicines for arthritis regularly.  Avoids oral NSAIDs due to A-fib and anticoagulation.  Labs reviewed 07/2022 RF 23 ANA neg CCP neg ESR 18  03/2021 Hepatitis panel neg  Imaging 06/14/2022 CT chest: atherosclerosis. Stable moderate hiatal hernia. Scattered subcm partially calcified pulmonary nodules, consistent with benign postinflammatory or postinfectious change given stability. Progressive  subpleural scarring with mild basilar reticular opacities, possibly due to atelectasis but unable to exclude ILD.   Activities of Daily Living:  Patient reports morning stiffness for 30 minutes.   Patient Reports nocturnal pain.  Difficulty dressing/grooming: Denies Difficulty climbing stairs: Reports Difficulty getting out of chair: Reports Difficulty using hands for taps, buttons, cutlery, and/or writing: Denies  Review of Systems  Constitutional:  Positive for fatigue.  HENT:  Positive for mouth dryness. Negative for mouth sores.   Eyes:  Positive for dryness.  Respiratory:  Positive for shortness of breath.   Cardiovascular:  Negative for chest pain and palpitations.  Gastrointestinal:  Positive for constipation and diarrhea. Negative for blood in stool.  Endocrine: Negative for increased urination.  Genitourinary:  Negative for involuntary urination.  Musculoskeletal:  Positive for joint pain, gait problem, joint pain, myalgias, muscle weakness, morning stiffness, muscle tenderness and myalgias. Negative for joint swelling.  Skin:  Negative for color change, rash, hair loss and sensitivity to sunlight.  Allergic/Immunologic: Negative for susceptible to infections.  Neurological:  Positive for dizziness and headaches.  Hematological:  Negative for swollen glands.  Psychiatric/Behavioral:  Positive for sleep disturbance. Negative for depressed mood. The patient is not nervous/anxious.     PMFS History:  Patient Active Problem List   Diagnosis Date Noted   DOE (dyspnea on exertion) 11/02/2022   ILD (interstitial lung disease) (HCC) 11/02/2022   IDA (iron deficiency anemia) 11/02/2022   Polyarthritis  with positive rheumatoid factor (HCC) 11/02/2022   Hypotension 08/30/2022   Overweight (BMI 25.0-29.9) 12/11/2021   Osteopenia of left forearm 01/16/2021   Aortic atherosclerosis (HCC) 08/11/2020   Hiatal hernia 08/11/2020   Pulmonary nodules 08/11/2020   Mixed hyperlipidemia  07/17/2019   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   Atrial flutter (HCC) 04/20/2015   Autonomic dysfunction 11/17/2014   Essential hypertension 10/19/2009   ATRIAL FIBRILLATION 10/19/2009    Past Medical History:  Diagnosis Date   HTN (hypertension)    (patient denies)   Neck pain    Paroxysmal A-fib (HCC)    CHADS2vasc =1   PONV (postoperative nausea and vomiting)    and had a siezure once at dentist    Typical atrial flutter (HCC)    Vasovagal syncope 04/28/13    Family History  Problem Relation Age of Onset   Hodgkin's lymphoma Mother    Heart attack Father 65   Breast cancer Paternal Aunt    Diabetes Maternal Grandfather    Sleep apnea Neg Hx    Past Surgical History:  Procedure Laterality Date   CARDIOVERSION N/A 04/22/2015   Procedure: CARDIOVERSION;  Surgeon: Quintella Reichert, MD;  Location: [facility];  Service: Cardiovascular;  Laterality: N/A;   KNEE SURGERY     TEE WITHOUT CARDIOVERSION N/A 04/22/2015   Procedure: TRANSESOPHAGEAL ECHOCARDIOGRAM (TEE);  Surgeon: Quintella Reichert, MD;  Location: [facility] ENDOSCOPY;  Service: Cardiovascular;  Laterality: N/A;   Social History   Social History Narrative   Not on file   Immunization History  Administered Date(s) Administered   Fluad Quad(high Dose 65+) 07/06/2022   Influenza Whole 07/02/2015   Influenza, High Dose Seasonal PF 07/09/2018, 07/15/2019   Influenza-Unspecified 07/13/2020   PPD Test 12/12/2022   Pneumococcal Conjugate-13 07/05/2016   Pneumococcal Polysaccharide-23 10/17/2015, 07/05/2017   Tdap 12/22/1998, 10/17/2015, 04/18/2020   Zoster Recombinat (Shingrix) 07/09/2018, 10/07/2018     Objective: Vital Signs: BP 107/68 (BP Location: Left Arm, Patient Position: Sitting, Cuff Size: Normal)   Pulse 71   Resp 14   Ht 5' 7.75" (1.721 m)   Wt 191 lb (86.6 kg)   BMI 29.26 kg/m    Physical Exam HENT:     Mouth/Throat:     Mouth: Mucous membranes are dry.     Pharynx: Oropharynx is clear.  Eyes:      Conjunctiva/sclera: Conjunctivae normal.  Cardiovascular:     Rate and Rhythm: Normal rate and regular rhythm.     Pulses: Normal pulses.     Heart sounds: Normal heart sounds.  Musculoskeletal:     Right lower leg: No edema.     Left lower leg: No edema.  Lymphadenopathy:     Cervical: No cervical adenopathy.  Skin:    General: Skin is warm and dry.     Findings: No rash.  Neurological:     Mental Status: She is alert.  Psychiatric:        Mood and Affect: Mood normal.      Musculoskeletal Exam:  Shoulders full ROM no tenderness or swelling Elbows full ROM no tenderness or swelling Wrists full ROM no tenderness or swelling Fingers squaring of 1st [facility] joints b/l, heberdon's nodes no palpable synovitis Knees right larger than left, patellofemoral crepitus present, no palpable effusions Ankles full ROM no tenderness or swelling MTPs full ROM no tenderness or swelling   Investigation: No additional findings.  Imaging: No results found.  Recent Labs: Lab Results  Component Value Date  WBC 6.6 12/12/2022   HGB 13.0 12/12/2022   PLT 248 12/12/2022   NA 139 04/05/2022   K 4.7 04/05/2022   CL 102 04/05/2022   CO2 22 04/05/2022   GLUCOSE 87 04/05/2022   BUN 11 04/05/2022   CREATININE 0.83 04/05/2022   BILITOT 0.2 12/11/2021   ALKPHOS 83 12/11/2021   AST 33 12/11/2021   ALT 31 12/11/2021   PROT 6.5 01/15/2023   ALBUMIN 4.2 12/11/2021   CALCIUM 9.8 04/05/2022   GFRAA 87 01/15/2020   QFTBGOLDPLUS INDETERMINATE (A) 11/02/2022    Speciality Comments: No specialty comments available.  Procedures:  No procedures performed Allergies: Penicillins   Assessment / Plan:     Visit Diagnoses: Polyarthritis with positive rheumatoid factor (HCC) - Plan: Protein Electrophoresis, (serum), IgG, IgA, IgM  Low positive rheumatoid factor but no evidence of inflammatory arthritis on exam or strongly suggested by history today.  Recent labs also showing normal inflammatory  markers.  Will check quantitative immunoglobulins for any other evidence of immune activation or for alternate cause of a potential false positive rheumatoid factor.  Discussed her existing osteoarthritis.  Pulmonary nodules ILD (interstitial lung disease) (HCC)  Recent reassuring pulmonary function testing improvement of symptoms with treatment of anemia.  Has ongoing monitoring planned with pulmonology clinic to get updated high-resolution CT.   Abnormal laboratory test result - Plan: Protein Electrophoresis, (serum), IgG, IgA, IgM  Low positive rheumatoid factor in 72 year old with possible ILD and pulmonary nodules does not clinically appear to have RA.  Also check a protein electrophoresis screening for alternate causes of rheumatoid factor.  Orders: Orders Placed This Encounter  Procedures   Protein Electrophoresis, (serum)   IgG, IgA, IgM   No orders of the defined types were placed in this encounter.    Follow-Up Instructions: No follow-ups on file.   Fuller Plan, MD  Note - This record has been created using [employer].  Chart creation errors have been sought, but may not always  have been located. Such creation errors do not reflect on  the standard of medical care.

## 2023-01-18 LAB — PROTEIN ELECTROPHORESIS, SERUM
Albumin ELP: 3.8 g/dL (ref 3.8–4.8)
Alpha 1: 0.3 g/dL (ref 0.2–0.3)
Alpha 2: 0.7 g/dL (ref 0.5–0.9)
Beta 2: 0.4 g/dL (ref 0.2–0.5)
Beta Globulin: 0.4 g/dL (ref 0.4–0.6)
Gamma Globulin: 0.9 g/dL (ref 0.8–1.7)
Total Protein: 6.5 g/dL (ref 6.1–8.1)

## 2023-01-18 LAB — IGG, IGA, IGM
IgG (Immunoglobin G), Serum: 990 mg/dL (ref 600–1540)
IgM, Serum: 145 mg/dL (ref 50–300)
Immunoglobulin A: 319 mg/dL (ref 70–320)

## 2023-01-22 ENCOUNTER — Other Ambulatory Visit: Payer: Self-pay | Admitting: Family Medicine

## 2023-01-22 DIAGNOSIS — R4589 Other symptoms and signs involving emotional state: Secondary | ICD-10-CM

## 2023-02-21 ENCOUNTER — Other Ambulatory Visit: Payer: Self-pay | Admitting: Family Medicine

## 2023-02-21 DIAGNOSIS — E782 Mixed hyperlipidemia: Secondary | ICD-10-CM

## 2023-03-19 ENCOUNTER — Other Ambulatory Visit: Payer: Self-pay | Admitting: Family Medicine

## 2023-03-19 DIAGNOSIS — Z1231 Encounter for screening mammogram for malignant neoplasm of breast: Secondary | ICD-10-CM

## 2023-03-20 ENCOUNTER — Other Ambulatory Visit: Payer: Self-pay | Admitting: Interventional Cardiology

## 2023-03-22 ENCOUNTER — Other Ambulatory Visit: Payer: Self-pay

## 2023-03-22 DIAGNOSIS — Z1211 Encounter for screening for malignant neoplasm of colon: Secondary | ICD-10-CM

## 2023-04-09 ENCOUNTER — Ambulatory Visit
Admission: RE | Admit: 2023-04-09 | Discharge: 2023-04-09 | Disposition: A | Discharge status: Home / Self Care | Payer: Federal, State, Local not specified - PPO | Source: Ambulatory Visit | Attending: Family Medicine | Admitting: Family Medicine

## 2023-04-09 DIAGNOSIS — Z1231 Encounter for screening mammogram for malignant neoplasm of breast: Secondary | ICD-10-CM

## 2023-04-09 LAB — COLOGUARD: COLOGUARD: NEGATIVE

## 2023-04-22 ENCOUNTER — Other Ambulatory Visit: Payer: Self-pay | Admitting: Family Medicine

## 2023-04-22 DIAGNOSIS — I480 Paroxysmal atrial fibrillation: Secondary | ICD-10-CM

## 2023-05-13 ENCOUNTER — Ambulatory Visit (HOSPITAL_COMMUNITY)
Admission: RE | Admit: 2023-05-13 | Discharge: 2023-05-13 | Disposition: A | Discharge status: Home / Self Care | Payer: Federal, State, Local not specified - PPO | Source: Ambulatory Visit | Attending: Nurse Practitioner | Admitting: Nurse Practitioner

## 2023-05-13 DIAGNOSIS — J849 Interstitial pulmonary disease, unspecified: Secondary | ICD-10-CM | POA: Diagnosis present

## 2023-05-22 ENCOUNTER — Other Ambulatory Visit: Payer: Self-pay | Admitting: Family Medicine

## 2023-05-22 DIAGNOSIS — E782 Mixed hyperlipidemia: Secondary | ICD-10-CM

## 2023-05-24 ENCOUNTER — Ambulatory Visit: Payer: Federal, State, Local not specified - PPO | Admitting: Internal Medicine

## 2023-05-24 ENCOUNTER — Encounter: Payer: Self-pay | Admitting: Internal Medicine

## 2023-05-24 VITALS — BP 120/80 | HR 65 | Ht 67.5 in | Wt 190.2 lb

## 2023-05-24 DIAGNOSIS — Z77120 Contact with and (suspected) exposure to mold (toxic): Secondary | ICD-10-CM

## 2023-05-24 DIAGNOSIS — R5383 Other fatigue: Secondary | ICD-10-CM

## 2023-05-24 DIAGNOSIS — R918 Other nonspecific abnormal finding of lung field: Secondary | ICD-10-CM

## 2023-05-24 DIAGNOSIS — K449 Diaphragmatic hernia without obstruction or gangrene: Secondary | ICD-10-CM

## 2023-05-24 DIAGNOSIS — J302 Other seasonal allergic rhinitis: Secondary | ICD-10-CM

## 2023-05-24 DIAGNOSIS — R0609 Other forms of dyspnea: Secondary | ICD-10-CM | POA: Diagnosis not present

## 2023-05-24 LAB — CBC WITH DIFFERENTIAL/PLATELET
Basophils Absolute: 0 10*3/uL (ref 0.0–0.1)
Basophils Relative: 0.8 % (ref 0.0–3.0)
Eosinophils Absolute: 0.1 10*3/uL (ref 0.0–0.7)
Eosinophils Relative: 1 % (ref 0.0–5.0)
HCT: 40.1 % (ref 36.0–46.0)
Hemoglobin: 12.8 g/dL (ref 12.0–15.0)
Lymphocytes Relative: 34.4 % (ref 12.0–46.0)
Lymphs Abs: 2.2 10*3/uL (ref 0.7–4.0)
MCHC: 31.8 g/dL (ref 30.0–36.0)
MCV: 88.8 fl (ref 78.0–100.0)
Monocytes Absolute: 0.6 10*3/uL (ref 0.1–1.0)
Monocytes Relative: 9.7 % (ref 3.0–12.0)
Neutro Abs: 3.4 10*3/uL (ref 1.4–7.7)
Neutrophils Relative %: 54.1 % (ref 43.0–77.0)
Platelets: 229 10*3/uL (ref 150.0–400.0)
RBC: 4.51 Mil/uL (ref 3.87–5.11)
RDW: 13.7 % (ref 11.5–15.5)
WBC: 6.3 10*3/uL (ref 4.0–10.5)

## 2023-05-24 NOTE — Progress Notes (Signed)
OV 07/24/2022 -presents to see Dr. Marchelle Gearing at the ILD center.   Subjective:  Patient ID: Heather Keith, female , DOB: 1951/06/02 , age 72 y.o. , MRN: [id number] , ADDRESS: [address] [place] [address] PCP Raliegh Ip, DO Patient Care Team: Raliegh Ip, DO as PCP - General (Family Medicine) Marinus Maw, MD as Consulting Physician (Cardiology)  This Provider for this visit: Treatment Team:  Attending Provider: Kalman Shan, MD    07/24/2022 -   Chief Complaint  Patient presents with   Consult    PT states since Covid in 2022, chronic DOE Pulmonary Fibrosis on CT 05/2022     HPI Heather Keith 72 y.o. -presents for an ILD evaluation.  She is the first cousin of the late Mr. Shawnie Pons who is my patient and died from pulmonary fibrosis secondary to chronic hypersensitive pneumonitis.  She presents with his wife because she has concerns about ILD herself.   She is known to have moderate-sized to large sized hiatal hernia.  She also has lung nodules.  She had a CT scan without contrast in November 2021 during which time ILD was not reported but the hiatal hernia was reported and stability in lung nodules was noted.  [facility] personal visualization does some basal infiltrates = after the patient left second opinion from thoracic radiologist this is more consistent with atelectasis.  It appears in January 2022 she had COVID-19.  Possibly sometime after that she started noticing shortness of breath for the last year and a half.  In the summer 2022 she did have cardiopulmonary stress test and echo and these were normal [see results below] recent diagnosis of anemia [see below] for she started iron supplementation and since then her shortness of breath is actually better.  Her current symptom score is listed below.  She also has a cough since July 2023.  The cough is also getting better.  Shortness of breath is associated with dizziness also started 1 and half  years ago and can happen during the day.  It gets better when she lies down.   [facility] Comprehensive ILD Questionnaire  Symptoms:     Past Medical History :  Moderate to large size hiatal hernia seen on her CT scan - Pulmonary nodules being followed - Remote history of infectious mononucleosis several decades ago -She had COVID in January 2022 and again in August 2023.  Both treated as outpatient - She has atrial fibrillation, hyperlipidemia, osteoarthritis   ROS:  Positive for fatigue for the last few years. - Positive for arthralgia - Does have dysphagia for let us - She does have dry eyes.  No saliva production since COVID in 2022. - She is lost 25 pounds of weight with the help of weight loss medication Wegovy - She does feel tired all the time and feels sleepy during the daytime not evaluated for sleep apnea  FAMILY HISTORY of LUNG DISEASE:  -Her first cousin was Mr. 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  Age of the home is 42 years. -In the past there was some mold in the curtain but she  changes the court now every 6 months. 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  There was dust with a mail = While she worked as a Radiation protection practitioner there was exposure to formaldehyde and other chemicals such as Marjean Donna  PULMONARY TOXICITY HISTORY (27 items):  denies  INVESTIGATIONS: below    CT Chest data  No results found.    PFT  ANA - neg July 2022  D-dimer 6.5 in July 2022   CPEX Aug 2022:  -  CPX:  Exercise testing with gas exchange demonstrates a normal peak VO2 of 16.7 ml/kg/min (104% of the age/gender/weight matched sedentary norms). The RER of 1.10 indicates a maximal effort. When adjusted to the patient's ideal body weight of 124 lb (56.3 kg) the peak VO2 is 26.62ml/kg (ibw)/min (123% of the ibw-adjusted predicted). The VE/VCO2 slope is normal. The oxygen uptake efficiency slope (OUES) is normal. The VO2 at the ventilatory threshold was normal at 81% of the predicted peak VO2. At peak exercise, the ventilation reached 51% of the measured MVV indicating ventilatory reserve remained. The O2pulse (a surrogate for stroke volume) increased with initial exercise, with little to no increase beyond initial exercise, at a peak of 12 ml/beat (134% predicted).    Conclusion: Exercise testing with gas exchange demonstrates normal functional capacity when compared to matched sedentary norms. There is no evidence for cardiopulmonary abnormality. There was mild chronotropic incompetence.    CT chest without contrast (not a HRCT - sept 2023 IMPRESSION: 1. No acute intrathoracic process. 2. Scattered subcentimeter partially calcified pulmonary nodules, demonstrating long-term stability and consistent with benign postinflammatory or postinfectious change. 3.  Progressive subpleural scarring/fibrosis, greatest in the mid to lower lung zones. Pattern can be seen with UIP/IPF. If further evaluation is desired, high-resolution chest CT could be considered. -> Was not reporte din 2021 CT but in my personal professional opon was present in 2021 scan  4. Stable moderate hiatal hernia. 5.  Aortic Atherosclerosis (ICD10-I70.0).     Electronically Signed   By: Sharlet Salina M.D.   On: 06/16/2022 16:04   ECHO summer 2023  -  Normal     11/02/2022: Today - follow up Patient presents today for follow up after PFTs. After her last visit, thoracic radiologist reviewed her imaging and was unsure if she actually had ILD. She does have risk factors so was encouraged to continue with workup and plan for HRCT in the summer 2024. She did complete her autoimmune panel workup. She has not heard results from these. Quantiferon TB was indeterminate. She denies any known exposure to TB, illicit drug use, close quarter living, or travel to areas with  high rates of TB. She denies night sweats, weight loss, fevers, hemoptysis. She also had a positive rheumatoid factor. She does tell me she has joint pains and occasionally swelling/redness. Mainly right knee, both shoulders, and left thumb. She does not think she has ever had workup for RA. She has had joint injections to her thumb before, which helps. 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  Regarding her breathing, she tells me that she actually feels better since she was here last. She has been undergoing treatment for her anemia and feels like this has made a huge difference. She has been playing pickle ball and exercising without difficulty. This is usually the only time she notices her shortness of breath and feels like it's relatively normal for the strenuous activity she's doing. She still has an occasional dry cough and throat clearing; attributes this to postnasal drainage. Otherwise aside from her  joint pains, she is feeling well.  Her pulmonary function testing today revealed normal spirometry and lung volumes. She did have a moderate diffusion defect but this was not corrected for hgb. She has not had recent CBC drawn.    OV 05/24/2023  Subjective:  Patient ID: Heather Keith, female , DOB: December 08, 1950 , age 35 y.o. , MRN: [id number] , ADDRESS: [address] [place] [address] PCP Raliegh Ip, DO Patient Care Team: Raliegh Ip, DO as PCP - General (Family Medicine) Marinus Maw, MD as Consulting Physician (Cardiology)  This Provider for this visit: Treatment Team:  Attending Provider: Kalman Shan, MD    05/24/2023 -   Chief Complaint  Patient presents with   Follow-up    F/up on CT scan     HPI Aeriana S Rayborn 72 y.o. -returns for follow-up.  I originally saw her 10 months ago because of concern of ILD in the setting of her cousin having hypersensitive pneumonitis and also she herself having COVID in 2022.  My first visit with her was in October 2023.  The radiologist thought there was ILD but I was not so sure.  She also had multiple pulmonary nodules.  Subsequently she is followed up with a pulmonary function test that is near normal/normal.  She did see Katie Cobb by this time the dyspnea was better.  She tells me now that the dyspnea continues to be better.  See the symptom score below and see this improvement in dyspnea and fatigue.  She is still mainly concerned about fatigue that started after the COVID and is still not fully better despite her playing pickle ball.  She is wondering about other causes.  She had a follow-up high-resolution CT chest that I personally visualized.  I do not see any ILD again.  There is no ILD either.  However there are some lung nodules.  Thoracic radiology Dr. Fredirick Lathe suspects ABPA.  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  Patient only has seasonal allergies she admitted but no real other asthma or  wheezing symptoms.  No paroxysmal nocturnal dyspnea no nocturnal symptoms. Interim Health status: No new complaints No new medical problems. No new surgeries. No ER visits. No Urgent care visits. No changes to medications  The main concern she still has is that post-COVID residual fatigue.  She she now reports a previous history of mold exposure in the bathroom.  She says when her daughter was in [place] came back the daughter also started feeling unwell.  Given the seasonal allergies and the  CT findings and the previous history of mold exposure [currently none] she is agreed to getting allergy testing done.   Of note: She hiatal hernia this was incidentally diagnosed during CT scans.  She has been aware of it for a few years.  She is never seen GI never seen surgery no active acid reflux.  She does admit to a large pill dysphagia especially large vitamin pills for over 10 or 20 years.    SYMPTOM SCALE - ILD 07/25/2022 05/24/2023   Current weight    O2 use ra ra  Shortness of Breath 0 -> 5 scale with 5 being worst (score 6 If unable to do)   At rest 0 0  Simple tasks - showers, clothes change, eating, shaving 3 0  Household (dishes, doing bed, laundry) 3 2  Shopping 3 0  Walking level at own pace 3 1  Walking up Stairs 4 2  Total (30-36) Dyspnea Score 16 5      Non-dyspnea symptoms (0-> 5 scale) 07/25/2022 05/24/2023   How bad is your cough? moderate 0  How bad is your fatigue Moderat to bad 2  How bad is nausea Not bad 0  How bad is vomiting?  Not bad 0  How bad is diarrhea? occ 0  How bad is anxiety? not 0  How bad is depression not 0  Any chronic pain - if so where and how bad Moderate knee pain     Simple office walk 224 (66+46 x 2) feet Pod A at [facility] x  3 laps goal with forehead probe 05/24/2023    O2 used ra   Number laps completed Sit stand x 15   Comments about pace x   Resting Pulse Ox/HR 98% and 65/min   Final Pulse Ox/HR 95% and 78/min   Desaturated </= 88%  no   Desaturated <= 3% points yes   Got Tachycardic >/= 90/min no   Symptoms at end of test x   Miscellaneous comments x      CT Chest data from date: HRCT 05/13/23  - personally visualized and independently interpreted : yes - my findings are: no ILD but agree with nodules Narrative & Impression  CLINICAL DATA:  Follow-up interstitial lung disease.   EXAM: CT CHEST WITHOUT CONTRAST   TECHNIQUE: Multidetector CT imaging of the chest was performed following the standard protocol without intravenous contrast. High resolution imaging of the lungs, as well as inspiratory and expiratory imaging, was performed.   RADIATION DOSE REDUCTION: This exam was performed according to the departmental dose-optimization program which includes automated exposure control, adjustment of the mA and/or kV according to patient size and/or use of iterative reconstruction technique.   COMPARISON:  06/14/2022 chest CT.   FINDINGS: Cardiovascular: Normal heart size. No significant pericardial effusion/thickening. Atherosclerotic nonaneurysmal thoracic aorta. Normal caliber pulmonary arteries.   Mediastinum/Nodes: No significant thyroid nodules. Unremarkable esophagus. No pathologically enlarged axillary, mediastinal or hilar lymph nodes, noting limited sensitivity for the detection of hilar adenopathy on this noncontrast study.   Lungs/Pleura: No pneumothorax. No pleural effusion. Multiple branching tubular foci of consolidation throughout the peripheral lungs bilaterally, increased in size and number, for example increased in the posterior peripheral left upper lobe to 1.7 x 1.0 cm (series 7/image 71) from 1.0 x 0.6 cm on 06/14/2022 CT and largely new in the posterior peripheral right upper lobe spanning up to 2.2 x 0.9 cm (series 7/image 50). Mild patchy tree-in-bud opacities are associated with these dominant tubular branching  opacities. A branching tubular 1.0 x 0.7 cm opacity in  the peripheral right lower lobe is similar size with increased central cavitary change. Several additional scattered solid pulmonary nodules are stable, for example 0.6 cm in the posteromedial left lower lobe (series 7/image 99). Minimal patchy reticulation and ground-glass opacity in the dependent lungs is unchanged. No significant regions of architectural distortion or frank honeycombing. No significant lobular air trapping or evidence of tracheobronchomalacia.   Upper abdomen: Large hiatal hernia.  Stomach is nondistended.   Musculoskeletal: No aggressive appearing focal osseous lesions. Moderate thoracic spondylosis.   IMPRESSION: 1. Multiple branching tubular foci of consolidation throughout the peripheral lungs bilaterally, increased in size and number since 06/14/2022 CT, with associated mild patchy tree-in-bud opacities. Leading considerations include allergic bronchopulmonary aspergillosis (ABPA) or chronic atypical mycobacterial infection (MAI). Suggest post treatment high-resolution chest CT follow-up in 6 months. 2. Large hiatal hernia. 3.  Aortic Atherosclerosis (ICD10-I70.0).     Electronically Signed   By: Delbert Phenix M.D.   On: 05/20/2023 08:26    PFT     Latest Ref Rng & Units 11/02/2022    1:32 PM  ILD indicators  FVC-Pre L 3.04   FVC-Predicted Pre % 81   FVC-Post L 3.08   FVC-Predicted Post % 82   TLC L 5.69   TLC Predicted % 95   DLCO uncorrected ml/min/mmHg 17.55   DLCO UNC %Pred % 74   DLCO Corrected ml/min/mmHg 17.55   DLCO COR %Pred % 74       LAB RESULTS last 96 hours No results found.  LAB RESULTS last 90 days Recent Results (from the past 2160 hour(s))  Cologuard     Status: None   Collection Time: 04/01/23  3:30 PM  Result Value Ref Range   COLOGUARD Negative Negative    Comment:  NEGATIVE TEST RESULT. A negative Cologuard result indicates a low likelihood that a colorectal cancer (CRC) or advanced adenoma (adenomatous polyps  with more advanced pre-malignant features)  is present. The chance that a person with a negative Cologuard test has a colorectal cancer is less than 1 in 1500 (negative predictive value >99.9%) or has an  advanced adenoma is less than  5.3% (negative predictive value 94.7%). These data are based on a prospective cross-sectional study of 10,000 individuals at average risk for colorectal cancer who were screened with both Cologuard and colonoscopy. (Imperiale T. et al, N Engl J Med 2014;370(14):1286-1297) The normal value (reference range) for this assay is negative.  COLOGUARD RE-SCREENING RECOMMENDATION: Periodic colorectal cancer screening is an important part of preventive healthcare for asymptomatic individuals at average risk for colorectal cancer.  Following a negative Cologuard result, the American Cancer Society and U.S.  Multi-Society Task Force screening guidelines recommend a Cologuard re-screening interval of 3 years.  References: American Cancer Society Guideline for Colorectal Cancer Screening: https://www.cancer.org/cancer/colon-rectal-cancer/detection-diagnosis-staging/acs-recommendations.html.; Rex DK, Boland CR, Dominitz JK, Colorectal Cancer Screening: Recommendations for Physicians and Patients from the U.S. Multi-Society Task Force on Colorectal Cancer Screening , Am J Gastroenterology 2017; 112:1016-1030.  TEST DESCRIPTION: Composite algorithmic analysis of stool DNA-biomarkers with hemoglobin immunoassay.   Quantitative values of individual biomarkers are not reportable and are not associated with individual biomarker result reference ranges. Cologuard is intended for colorectal cancer screening of adults of either sex, 45 years or older, who are at average-risk for colorectal cancer (CRC). Cologuard has been approved for use by the U.S. FDA. The performance of Cologuard was  established in a cross sectional study of average-risk  adults aged 20-84. Cologuard performance in patients ages 73  to 36 years was estimated by sub-group analysis of near-age groups. Colonoscopies performed for a positive result may find as the most clinically significant lesion: colorectal cancer [4.0%], advanced adenoma (including sessile serrated polyps greater than or equal to 1cm diameter) [20%] or non- advanced adenoma [31%]; or no colorectal neoplasia [45%]. These estimates are derived from a prospective cross-sectional screening study of 10,000 individuals at average risk for colorectal cancer who were screened with both Cologuard and colonoscopy. (Imperiale T. et al, Macy Mis J Med 2014;370(14):1286-1297.) Cologuard may produce a false negative or false positive result (no colorectal cancer or precancerous polyp present at colonoscopy follow up). A negative Cologuard test result does not guarantee the absence of CRC or advanced adenoma (pre-cancer). The current Cologuard  screening interval is every 3 years. Science writer and U.S. Therapist, music). Cologuard performance data in a 10,000 patient pivotal study using colonoscopy as the reference method can be accessed at the following location: www.exactlabs.com/results. Additional description of the Cologuard test process, warnings and precautions can be found at www.cologuard.com.          has a past medical history of HTN (hypertension), Neck pain, Paroxysmal A-fib (HCC), PONV (postoperative nausea and vomiting), Typical atrial flutter (HCC), and Vasovagal syncope (04/28/13).   reports that she has never smoked. She has been exposed to tobacco smoke. She has never used smokeless tobacco.  Past Surgical History:  Procedure Laterality Date   CARDIOVERSION N/A 04/22/2015   Procedure: CARDIOVERSION;  Surgeon: Quintella Reichert, MD;  Location: [facility] ENDOSCOPY;  Service: Cardiovascular;  Laterality: N/A;   KNEE SURGERY     TEE WITHOUT CARDIOVERSION N/A 04/22/2015   Procedure: TRANSESOPHAGEAL ECHOCARDIOGRAM (TEE);  Surgeon: Quintella Reichert, MD;   Location: [facility] ENDOSCOPY;  Service: Cardiovascular;  Laterality: N/A;    Allergies  Allergen Reactions   Penicillins Rash    Immunization History  Administered Date(s) Administered   Fluad Quad(high Dose 65+) 07/06/2022   Influenza Whole 07/02/2015   Influenza, High Dose Seasonal PF 07/09/2018, 07/15/2019   Influenza-Unspecified 07/13/2020   PPD Test 12/12/2022   Pneumococcal Conjugate-13 07/05/2016   Pneumococcal Polysaccharide-23 10/17/2015, 07/05/2017   Tdap 12/22/1998, 10/17/2015, 04/18/2020   Zoster Recombinant(Shingrix) 07/09/2018, 10/07/2018    Family History  Problem Relation Age of Onset   Hodgkin's lymphoma Mother    Heart attack Father 58   Breast cancer Paternal Aunt    Diabetes Maternal Grandfather    Sleep apnea Neg Hx      Current Outpatient Medications:    acetaminophen (TYLENOL) 500 MG tablet, Take 500 mg by mouth every 6 (six) hours as needed (pain). , Disp: , Rfl:    Coenzyme Q10 (CO Q-10) 400 MG CAPS, Take 1 tablet by mouth daily., Disp: , Rfl:    escitalopram (LEXAPRO) 10 MG tablet, TAKE 1 TABLET BY MOUTH EVERY DAY, Disp: 90 tablet, Rfl: 1   Ferrous Sulfate (IRON SLOW RELEASE) 142 (45 Fe) MG TBCR, Take 1 tablet by mouth daily., Disp: , Rfl:    flecainide (TAMBOCOR) 100 MG tablet, TAKE 1 TABLET BY MOUTH TWICE DAILY, Disp: 180 tablet, Rfl: 0   fluticasone (FLONASE) 50 MCG/ACT nasal spray, INSTILL TWO SPRAYS into IN EACH NOSTRIL DAILY, Disp: 16 g, Rfl: 6   LORATADINE PO, Take 1 tablet by mouth daily., Disp: , Rfl:    Multiple Vitamins-Minerals (MULTIVITAMIN WITH MINERALS) tablet, Take 1 tablet by mouth daily., Disp: , Rfl:    Probiotic Product (  PROBIOTIC PO), Take 1 tablet by mouth daily., Disp: , Rfl:    rosuvastatin (CRESTOR) 20 MG tablet, TAKE 1 TABLET BY MOUTH EVERY OTHER DAY, Disp: 45 tablet, Rfl: 0   SF 5000 PLUS 1.1 % CREA dental cream, Take by mouth as directed., Disp: , Rfl:    XARELTO 20 MG TABS tablet, TAKE 1 TABLET BY MOUTH DAILY WITH SUPPER,  Disp: 90 tablet, Rfl: 3   zolpidem (AMBIEN) 5 MG tablet, Take 1 tablet (5 mg total) by mouth at bedtime as needed for sleep. Put on file, Disp: 30 tablet, Rfl: 5   albuterol (VENTOLIN HFA) 108 (90 Base) MCG/ACT inhaler, Inhale 2 puffs into the lungs every 6 (six) hours as needed for wheezing or shortness of breath. (Patient not taking: Reported on 01/15/2023), Disp: 8 g, Rfl: 0      Objective:   Vitals:   05/24/23 1106  BP: 120/80  Pulse: 65  SpO2: 100%  Weight: 190 lb 3.2 oz (86.3 kg)  Height: 5' 7.5" (1.715 m)    Estimated body mass index is 29.35 kg/m as calculated from the following:   Height as of this encounter: 5' 7.5" (1.715 m).   Weight as of this encounter: 190 lb 3.2 oz (86.3 kg).  @WEIGHTCHANGE @  [employer]   05/24/23 1106  Weight: 190 lb 3.2 oz (86.3 kg)     Physical Exam   General: No distress. Looks well O2 at rest: no Cane present: no Sitting in wheel chair: no Frail: no Obese: n Neuro: Alert and Oriented x 3. GCS 15. Speech normal Psych: Pleasant Resp:  Barrel Chest - on.  Wheeze - o, Crackles - no, No overt respiratory distress CVS: Normal heart sounds. Murmurs - no Ext: Stigmata of Connective Tissue Disease - no HEENT: Normal upper airway. PEERL +. No post nasal drip        Assessment:       ICD-10-CM   1. DOE (dyspnea on exertion)  R06.09 CBC w/Diff    IgE    Perennial allergen profile IgE    2. Other fatigue  R53.83     3. Seasonal allergies  J30.2     4. 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 Multiple lung nodules on CT  R91.8 CT Chest Wo Contrast    6. Hiatal hernia  K44.9 Ambulatory referral to Gastroenterology         Plan:     Patient Instructions     ICD-10-CM   1. DOE (dyspnea on exertion)  R06.09     2. Other fatigue  R53.83     3. Seasonal allergies  J30.2     4. 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 Multiple lung nodules on CT  R91.8     6. Hiatal hernia  K44.9      DOE (dyspnea on exertion) Other  fatigue Seasonal allergies Mold exposure Multiple lung nodules on CT  -Glad you are overall better but you do have some nodules that is suggestive of a condition called ABPA for mold exposure although it is interesting that you not having any active wheezing other than seasonal allergies.  Plan -Check CBC with differential, blood IgE and RAST allergy panel  - We will inform you of the results -Repeat CT scan of the chest without contrast in 9 months   Hiatal hernia  Plan - Refer to GI because you hiatal hernia is fairly large even though you do not have any symptoms  FOLLOWUP Return in about 8 months (around 01/22/2024) for after HRCT chest, with Dr Marchelle Gearing.  (Level 04 E&M 2024: Estb >= 30 min visit type: on-site physical face to visit visit spent in total care time and counseling or/and coordination of care by this undersigned MD - Dr Kalman Shan. This includes one or more of the following on this same day 05/24/2023: pre-charting, chart review, note writing, documentation discussion of test results, diagnostic or treatment recommendations, prognosis, risks and benefits of management options, instructions, education, compliance or risk-factor reduction. It excludes time spent by the CMA or office staff in the care of the patient . Actual time is 35 min)   SIGNATURE    Dr. Kalman Shan, M.D., F.C.C.P,  Pulmonary and Critical Care Medicine Staff Physician, [facility] Director - Interstitial Lung Disease  Program  Pulmonary Fibrosis [facility] at [facility] [place], [place], [address]  Pager: [phone], If no answer or between  15:00h - 7:00h: call [phone] Telephone: [phone]  12:12 PM 05/24/2023

## 2023-05-24 NOTE — Patient Instructions (Addendum)
ICD-10-CM   1. DOE (dyspnea on exertion)  R06.09     2. Other fatigue  R53.83     3. Seasonal allergies  J30.2     4. 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 Multiple lung nodules on CT  R91.8     6. 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  Plan -Check CBC with differential, blood IgE and RAST allergy panel  - We will inform you of the results -Repeat CT scan of the chest without contrast in 9 months   Hiatal hernia  Plan - Refer to GI because you hiatal hernia is fairly large even though you do not have any symptoms

## 2023-05-27 LAB — IGE: IgE (Immunoglobulin E), Serum: 140 kU/L — ABNORMAL HIGH (ref <=114)

## 2023-05-29 LAB — ALLERGEN PROFILE, PERENNIAL ALLERGEN IGE

## 2023-05-31 ENCOUNTER — Encounter: Payer: Self-pay | Admitting: Physician Assistant

## 2023-06-09 ENCOUNTER — Other Ambulatory Visit: Payer: Self-pay | Admitting: Family Medicine

## 2023-06-09 DIAGNOSIS — F5101 Primary insomnia: Secondary | ICD-10-CM

## 2023-06-11 ENCOUNTER — Encounter: Payer: Self-pay | Admitting: Family Medicine

## 2023-06-11 ENCOUNTER — Ambulatory Visit (INDEPENDENT_AMBULATORY_CARE_PROVIDER_SITE_OTHER): Payer: Federal, State, Local not specified - PPO | Admitting: Family Medicine

## 2023-06-11 VITALS — BP 111/69 | HR 69 | Temp 98.7°F | Ht 67.0 in | Wt 188.4 lb

## 2023-06-11 DIAGNOSIS — Z0001 Encounter for general adult medical examination with abnormal findings: Secondary | ICD-10-CM

## 2023-06-11 DIAGNOSIS — M0580 Other rheumatoid arthritis with rheumatoid factor of unspecified site: Secondary | ICD-10-CM

## 2023-06-11 DIAGNOSIS — I480 Paroxysmal atrial fibrillation: Secondary | ICD-10-CM | POA: Diagnosis not present

## 2023-06-11 DIAGNOSIS — I700 Atherosclerosis of aorta: Secondary | ICD-10-CM

## 2023-06-11 DIAGNOSIS — R5382 Chronic fatigue, unspecified: Secondary | ICD-10-CM

## 2023-06-11 DIAGNOSIS — E663 Overweight: Secondary | ICD-10-CM | POA: Diagnosis not present

## 2023-06-11 DIAGNOSIS — E782 Mixed hyperlipidemia: Secondary | ICD-10-CM | POA: Diagnosis not present

## 2023-06-11 DIAGNOSIS — J849 Interstitial pulmonary disease, unspecified: Secondary | ICD-10-CM

## 2023-06-11 DIAGNOSIS — R4589 Other symptoms and signs involving emotional state: Secondary | ICD-10-CM

## 2023-06-11 DIAGNOSIS — F5101 Primary insomnia: Secondary | ICD-10-CM

## 2023-06-11 DIAGNOSIS — Z0000 Encounter for general adult medical examination without abnormal findings: Secondary | ICD-10-CM

## 2023-06-11 LAB — BAYER DCA HB A1C WAIVED: HB A1C (BAYER DCA - WAIVED): 5.6 % (ref 4.8–5.6)

## 2023-06-11 MED ORDER — ROSUVASTATIN CALCIUM 20 MG PO TABS
20.0000 mg | ORAL_TABLET | ORAL | 3 refills | Status: DC
Start: 2023-06-11 — End: 2023-12-09

## 2023-06-11 MED ORDER — ESCITALOPRAM OXALATE 10 MG PO TABS
10.0000 mg | ORAL_TABLET | Freq: Every day | ORAL | 3 refills | Status: DC
Start: 2023-06-11 — End: 2023-12-09

## 2023-06-11 MED ORDER — ZOLPIDEM TARTRATE 5 MG PO TABS
5.0000 mg | ORAL_TABLET | Freq: Every evening | ORAL | 5 refills | Status: DC | PRN
Start: 2023-06-15 — End: 2023-12-09

## 2023-06-11 NOTE — Patient Instructions (Signed)
Fatigue If you have fatigue, you feel tired all the time and have a lack of energy or a lack of motivation. Fatigue may make it difficult to start or complete tasks because of exhaustion. Occasional or mild fatigue is often a normal response to activity or life. However, long-term (chronic) or extreme fatigue may be a symptom of a medical condition such as: Depression. Not having enough red blood cells or hemoglobin in the blood (anemia). A problem with a small gland located in the lower front part of the neck (thyroid disorder). Rheumatologic conditions. These are problems related to the body's defense system (immune system). Infections, especially certain viral infections. Fatigue can also lead to negative health outcomes over time. Follow these instructions at home: Medicines Take over-the-counter and prescription medicines only as told by your health care provider. Take a multivitamin if told by your health care provider. Do not use herbal or dietary supplements unless they are approved by your health care provider. Eating and drinking  Avoid heavy meals in the evening. Eat a well-balanced diet, which includes lean proteins, whole grains, plenty of fruits and vegetables, and low-fat dairy products. Avoid eating or drinking too many products with caffeine in them. Avoid alcohol. Drink enough fluid to keep your urine pale yellow. Activity  Exercise regularly, as told by your health care provider. Use or practice techniques to help you relax, such as yoga, tai chi, meditation, or massage therapy. Lifestyle Change situations that cause you stress. Try to keep your work and personal schedules in balance. Do not use recreational or illegal drugs. General instructions Monitor your fatigue for any changes. Go to bed and get up at the same time every day. Avoid fatigue by pacing yourself during the day and getting enough sleep at night. Maintain a healthy weight. Contact a health care  provider if: Your fatigue does not get better. You have a fever. You suddenly lose or gain weight. You have headaches. You have trouble falling asleep or sleeping through the night. You feel angry, guilty, anxious, or sad. You have swelling in your legs or another part of your body. Get help right away if: You feel confused, feel like you might faint, or faint. Your vision is blurry or you have a severe headache. You have severe pain in your abdomen, your back, or the area between your waist and hips (pelvis). You have chest pain, shortness of breath, or an irregular or fast heartbeat. You are unable to urinate, or you urinate less than normal. You have abnormal bleeding from the rectum, nose, lungs, nipples, or, if you are female, the vagina. You vomit blood. You have thoughts about hurting yourself or others. These symptoms may be an emergency. Get help right away. Call 911. Do not wait to see if the symptoms will go away. Do not drive yourself to the hospital. Get help right away if you feel like you may hurt yourself or others, or have thoughts about taking your own life. Go to your nearest emergency room or: Call 911. Call the National Suicide Prevention Lifeline at 1-800-273-8255 or 988. This is open 24 hours a day. Text the Crisis Text Line at 741741. Summary If you have fatigue, you feel tired all the time and have a lack of energy or a lack of motivation. Fatigue may make it difficult to start or complete tasks because of exhaustion. Long-term (chronic) or extreme fatigue may be a symptom of a medical condition. Exercise regularly, as told by your health care provider.   Change situations that cause you stress. Try to keep your work and personal schedules in balance. This information is not intended to replace advice given to you by your health care provider. Make sure you discuss any questions you have with your health care provider. Document Revised: 07/10/2021 Document  Reviewed: 07/10/2021 Elsevier Patient Education  2024 Elsevier Inc.  

## 2023-06-11 NOTE — Progress Notes (Signed)
Heather Keith is a 72 y.o. female presents to office today for annual physical exam examination.    Concerns today include: 1.  Fatigue She reports ongoing chronic fatigue.  Her daughter, who is a Advice worker recommended that she consider perhaps going on Armour Thyroid.  Her daughter's naturopathic physician put her on that particular medication and it seemed to help with energy quite a bit.  Patient was wanting to know if this is appropriate with her given her normal thyroid function in the past.  She denies any bleeding.  She reports compliance with her medications for her heart.  Utilizes Ambien as needed sleep.  She reports no fluttering, shortness of breath or chest pain but does report ongoing waxing and waning of energy.  Today's a good day.  She plans to play pickle ball today in fact but has not been regularly exercising due to waxing and waning energy levels.  She did have bilateral knee injections which caused some flushing and facial itching recently.  This is the first time that this is ever happened to her.  She has been considering going back on Wegovy but is not sure that her insurance will cover.  She has gained weight again and this worries her.  Occupation: Retired  Health Maintenance Due  Topic Date Due   INFLUENZA VACCINE  05/02/2023   Refills needed today: All  Immunization History  Administered Date(s) Administered   Fluad Quad(high Dose 65+) 07/06/2022   Influenza Whole 07/02/2015   Influenza, High Dose Seasonal PF 07/09/2018, 07/15/2019   Influenza-Unspecified 07/13/2020   PPD Test 12/12/2022   Pneumococcal Conjugate-13 07/05/2016   Pneumococcal Polysaccharide-23 10/17/2015, 07/05/2017   Tdap 12/22/1998, 10/17/2015, 04/18/2020   Zoster Recombinant(Shingrix) 07/09/2018, 10/07/2018   Past Medical History:  Diagnosis Date   HTN (hypertension)    (patient denies)   Neck pain    Paroxysmal A-fib (HCC)    CHADS2vasc =1   PONV (postoperative nausea  and vomiting)    and had a siezure once at dentist    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   Typical atrial flutter (HCC)    Vasovagal syncope 04/28/2013   Social History   Socioeconomic History   Marital status: Widowed    Spouse name: Not on file   Number of children: Not on file   Years of education: Not on file   Highest education level: Not on file  Occupational History   Not on file  Tobacco Use   Smoking status: Never    Passive exposure: Past   Smokeless tobacco: Never  Vaping Use   Vaping status: Never Used  Substance and Sexual Activity   Alcohol use: Yes    Comment: occasionally   Drug use: No   Sexual activity: Not on file  Other Topics Concern   Not on file  Social History Narrative   Not on file   Social Determinants of Health   Financial Resource Strain: Not on file  Food Insecurity: Not on file  Transportation Needs: Not on file  Physical Activity: Not on file  Stress: Not on file  Social Connections: Unknown (02/11/2022)   Received from [facility]   Social Network    Social Network: Not on file  Intimate Partner Violence: Unknown (01/04/2022)   Received from [facility]   HITS    Physically Hurt: Not on file    Insult or Talk Down To: Not on file    Threaten Physical Harm: Not on file  Scream or Curse: Not on file   Past Surgical History:  Procedure Laterality Date   CARDIOVERSION N/A 04/22/2015   Procedure: CARDIOVERSION;  Surgeon: Quintella Reichert, MD;  Location: [facility] ENDOSCOPY;  Service: Cardiovascular;  Laterality: N/A;   KNEE SURGERY     TEE WITHOUT CARDIOVERSION N/A 04/22/2015   Procedure: TRANSESOPHAGEAL ECHOCARDIOGRAM (TEE);  Surgeon: Quintella Reichert, MD;  Location: [facility] ENDOSCOPY;  Service: Cardiovascular;  Laterality: N/A;   Family History  Problem Relation Age of Onset   Hodgkin's lymphoma Mother    Heart attack Father 1   Breast cancer Paternal Aunt    Diabetes Maternal Grandfather    Sleep apnea Neg Hx     Current  Outpatient Medications:    acetaminophen (TYLENOL) 500 MG tablet, Take 500 mg by mouth every 6 (six) hours as needed (pain). , Disp: , Rfl:    albuterol (VENTOLIN HFA) 108 (90 Base) MCG/ACT inhaler, Inhale 2 puffs into the lungs every 6 (six) hours as needed for wheezing or shortness of breath., Disp: 8 g, Rfl: 0   Coenzyme Q10 (CO Q-10) 400 MG CAPS, Take 1 tablet by mouth daily., Disp: , Rfl:    Ferrous Sulfate (IRON SLOW RELEASE) 142 (45 Fe) MG TBCR, Take 1 tablet by mouth daily., Disp: , Rfl:    flecainide (TAMBOCOR) 100 MG tablet, TAKE 1 TABLET BY MOUTH TWICE DAILY, Disp: 180 tablet, Rfl: 0   fluticasone (FLONASE) 50 MCG/ACT nasal spray, INSTILL TWO SPRAYS into IN EACH NOSTRIL DAILY, Disp: 16 g, Rfl: 6   LORATADINE PO, Take 1 tablet by mouth daily., Disp: , Rfl:    Multiple Vitamins-Minerals (MULTIVITAMIN WITH MINERALS) tablet, Take 1 tablet by mouth daily., Disp: , Rfl:    Probiotic Product (PROBIOTIC PO), Take 1 tablet by mouth daily., Disp: , Rfl:    SF 5000 PLUS 1.1 % CREA dental cream, Take by mouth as directed., Disp: , Rfl:    XARELTO 20 MG TABS tablet, TAKE 1 TABLET BY MOUTH DAILY WITH SUPPER, Disp: 90 tablet, Rfl: 3   escitalopram (LEXAPRO) 10 MG tablet, Take 1 tablet (10 mg total) by mouth daily., Disp: 90 tablet, Rfl: 3   rosuvastatin (CRESTOR) 20 MG tablet, Take 1 tablet (20 mg total) by mouth every other day., Disp: 45 tablet, Rfl: 3   [START ON 06/15/2023] zolpidem (AMBIEN) 5 MG tablet, Take 1 tablet (5 mg total) by mouth at bedtime as needed for sleep. Put on file, Disp: 30 tablet, Rfl: 5  Allergies  Allergen Reactions   Penicillins Rash     ROS: Review of Systems Pertinent items noted in HPI and remainder of comprehensive ROS otherwise negative.    Physical exam BP 111/69   Pulse 69   Temp 98.7 F (37.1 C)   Ht 5\' 7"  (1.702 m)   Wt 188 lb 6.4 oz (85.5 kg)   SpO2 96%   BMI 29.51 kg/m  General appearance: alert, cooperative, appears stated age, and no  distress Head: Normocephalic, without obvious abnormality, atraumatic Eyes: negative findings: lids and lashes normal, conjunctivae and sclerae normal, corneas clear, and pupils equal, round, reactive to light and accomodation Ears: normal TM's and external ear canals both ears Nose: Nares normal. Septum midline. Mucosa normal. No drainage or sinus tenderness. Throat: lips, mucosa, and tongue normal; teeth and gums normal Neck: no adenopathy, no carotid bruit, supple, symmetrical, trachea midline, and thyroid not enlarged, symmetric, no tenderness/mass/nodules Back: symmetric, no curvature. ROM normal. No CVA tenderness. Lungs: clear to auscultation bilaterally  Heart: regular rate and rhythm, S1, S2 normal, no murmur, click, rub or gallop Abdomen: soft, non-tender; bowel sounds normal; no masses,  no organomegaly Extremities: extremities normal, atraumatic, no cyanosis or edema Pulses: 2+ and symmetric Skin: Skin color, texture, turgor normal. No rashes or lesions Lymph nodes: Cervical, supraclavicular, and axillary nodes normal. Neurologic: Grossly normal      06/11/2023    9:18 AM 12/12/2022   10:28 AM 06/12/2022    8:58 AM  Depression screen PHQ 2/9  Decreased Interest 0 0 0  Down, Depressed, Hopeless 0 0 0  PHQ - 2 Score 0 0 0  Altered sleeping 0 0 1  Tired, decreased energy 0 0 1  Change in appetite 0 0 0  Feeling bad or failure about yourself  0 0 0  Trouble concentrating 0 0 0  Moving slowly or fidgety/restless 0 0 0  Suicidal thoughts 0 0 0  PHQ-9 Score 0 0 2  Difficult doing work/chores Not difficult at all Not difficult at all Not difficult at all      06/11/2023    9:18 AM 12/12/2022   10:28 AM 06/12/2022    8:58 AM 04/24/2022    2:42 PM  GAD 7 : Generalized Anxiety Score  Nervous, Anxious, on Edge 0 0 0 0  Control/stop worrying 0 0 0 0  Worry too much - different things 0 0 0 0  Trouble relaxing 0 0 0 0  Restless 0 0 0 0  Easily annoyed or irritable 0 0 0 0   Afraid - awful might happen 0 0 0 0  Total GAD 7 Score 0 0 0 0  Anxiety Difficulty Not difficult at all Not difficult at all Not difficult at all Not difficult at all     Assessment/ Plan: Ena Dawley here for annual physical exam.   Annual physical exam  Paroxysmal atrial fibrillation (HCC) - Plan: CMP14+EGFR  Aortic atherosclerosis (HCC) - Plan: CMP14+EGFR, Lipid Panel  Mixed hyperlipidemia - Plan: CMP14+EGFR, Lipid Panel, rosuvastatin (CRESTOR) 20 MG tablet  ILD (interstitial lung disease) (HCC)  Polyarthritis with positive rheumatoid factor (HCC) - Plan: CMP14+EGFR  Primary insomnia - Plan: zolpidem (AMBIEN) 5 MG tablet  Moodiness - Plan: escitalopram (LEXAPRO) 10 MG tablet  Chronic fatigue - Plan: CMP14+EGFR, Thyroid Panel With TSH, T4, Free, Bayer DCA Hb A1c Waived  Overweight (BMI 25.0-29.9) - Plan: Bayer DCA Hb A1c Waived  Rate and rhythm controlled A-fib today.  No red flag signs or symptoms from a an anticoagulant standpoint.  Fasting labs were collected today.  She will continue current regimen with rosuvastatin for cholesterol control given known aortic atherosclerosis  Interstitial lung disease seems to be chronic and stable.  We will check renal function given history of RF positive polyarthritis.  I do question of autoimmune disease, underlying heart disease etc. contributes to her chronic fatigue but I am glad to look for metabolic etiology.  We discussed that I am a bit hesitant to add Armour Thyroid to her regimen given known atrial fibrillation and propensity to cause exacerbation of A-fib runs with this type of medication, particular in somebody who does not have underlying thyroid disease.  I fear that the risk of the medication may outweigh the benefit.  I am glad to revisit Wegovy with her should she desire.  I think that it is possible the insurance will actually cover this next year given recent cardiac indication and other changes and other plans.   Otherwise, could consider getting  the medication from compounding pharmacy and she will let me know if this is something she desires.  I have renewed her Ambien.  Again something that could be contributing to chronic fatigue.  Something to keep in mind.  UDS and CSA are up-to-date and the national narcotic database was reviewed with no red flags  Counseled on healthy lifestyle choices, including diet (rich in fruits, vegetables and lean meats and low in salt and simple carbohydrates) and exercise (at least 30 minutes of moderate physical activity daily).  Patient to follow up 6 months, sooner if concerns arise  Antasia Haider M. Nadine Counts, DO

## 2023-06-12 LAB — CMP14+EGFR
ALT: 12 IU/L (ref 0–32)
AST: 16 IU/L (ref 0–40)
Albumin: 4.1 g/dL (ref 3.8–4.8)
Alkaline Phosphatase: 81 IU/L (ref 44–121)
BUN/Creatinine Ratio: 19 (ref 12–28)
BUN: 14 mg/dL (ref 8–27)
Bilirubin Total: 0.2 mg/dL (ref 0.0–1.2)
CO2: 26 mmol/L (ref 20–29)
Calcium: 9.5 mg/dL (ref 8.7–10.3)
Chloride: 101 mmol/L (ref 96–106)
Creatinine, Ser: 0.74 mg/dL (ref 0.57–1.00)
Globulin, Total: 2.3 g/dL (ref 1.5–4.5)
Glucose: 95 mg/dL (ref 70–99)
Potassium: 4.5 mmol/L (ref 3.5–5.2)
Sodium: 139 mmol/L (ref 134–144)
Total Protein: 6.4 g/dL (ref 6.0–8.5)
eGFR: 86 mL/min/{1.73_m2} (ref >59)

## 2023-06-12 LAB — THYROID PANEL WITH TSH
Free Thyroxine Index: 2.3 (ref 1.2–4.9)
T3 Uptake Ratio: 29 % (ref 24–39)
T4, Total: 7.9 ug/dL (ref 4.5–12.0)
TSH: 3.14 u[IU]/mL (ref 0.450–4.500)

## 2023-06-12 LAB — LIPID PANEL
Chol/HDL Ratio: 2.2 ratio (ref 0.0–4.4)
Cholesterol, Total: 174 mg/dL (ref 100–199)
HDL: 79 mg/dL (ref >39)
LDL Chol Calc (NIH): 82 mg/dL (ref 0–99)
Triglycerides: 69 mg/dL (ref 0–149)
VLDL Cholesterol Cal: 13 mg/dL (ref 5–40)

## 2023-06-12 LAB — T4, FREE: Free T4: 1.51 ng/dL (ref 0.82–1.77)

## 2023-06-19 ENCOUNTER — Other Ambulatory Visit: Payer: Self-pay | Admitting: Interventional Cardiology

## 2023-07-31 ENCOUNTER — Encounter: Payer: Self-pay | Admitting: Physician Assistant

## 2023-07-31 ENCOUNTER — Ambulatory Visit: Payer: Federal, State, Local not specified - PPO | Admitting: Physician Assistant

## 2023-07-31 VITALS — BP 104/68 | HR 80 | Ht 67.0 in | Wt 189.2 lb

## 2023-07-31 DIAGNOSIS — K449 Diaphragmatic hernia without obstruction or gangrene: Secondary | ICD-10-CM | POA: Diagnosis not present

## 2023-07-31 DIAGNOSIS — R1319 Other dysphagia: Secondary | ICD-10-CM

## 2023-07-31 NOTE — Patient Instructions (Signed)
You have been scheduled for an Upper GI Series at [facility]. Your appointment is on 08/02/2023 at 9:00am. Please arrive 15 minutes prior to your test for registration. Make sure not to eat or drink anything after midnight on the night before your test. If you need to reschedule, please call radiology at [phone]. ________________________________________________________________ An upper GI series uses x rays to help diagnose problems of the upper GI tract, which includes the esophagus, stomach, and duodenum. The duodenum is the first part of the small intestine. An upper GI series is conducted by a radiology technologist or a radiologist--a doctor who specializes in x-ray imaging--at a hospital or outpatient center. While sitting or standing in front of an x-ray machine, the patient drinks barium liquid, which is often white and has a chalky consistency and taste. The barium liquid coats the lining of the upper GI tract and makes signs of disease show up more clearly on x rays. X-ray video, called fluoroscopy, is used to view the barium liquid moving through the esophagus, stomach, and duodenum. Additional x rays and fluoroscopy are performed while the patient lies on an x-ray table. To fully coat the upper GI tract with barium liquid, the technologist or radiologist may press on the abdomen or ask the patient to change position. Patients hold still in various positions, allowing the technologist or radiologist to take x rays of the upper GI tract at different angles. If a technologist conducts the upper GI series, a radiologist will later examine the images to look for problems.  This test typically takes about 1 hour to complete. __________________________________________________________________  I appreciate the opportunity to care for you. Hyacinth Meeker, PA-C

## 2023-07-31 NOTE — Progress Notes (Signed)
Chief Complaint: Hiatal hernia  HPI:    Heather Keith is a 72 year old female with a past medical history as listed below including A-fib on Xarelto (04/10/2022 echo with LVEF 65-70%) and interstitial lung disease, who was referred to me by Raliegh Ip, DO for a complaint of hiatal hernia.    05/20/2023 CT chest with multiple branching tubular foci of consolidation throughout the peripheral lungs bilaterally increased in size and number since 06/14/2022.  Also large hiatal hernia.    05/24/2023 patient followed with Dr. Marchelle Gearing with pulmonology.  At that time her breathing was doing better.  They briefly discussed hiatal hernia.  She had never had any active reflux but did admit to large pill dysphagia especially with large vitamin pills for the past 10 to 20 years.  At that time she was referred to [place] due to large hiatal hernia.    Today, the patient tells me that she has always known she has had a hiatal hernia but it has never bothered her.  She has had occasional trouble with the large multivitamins getting hung in her throat but that has been consistent over the past 10 to 20 years and has not changed.  She is here really at the request of her pulmonologist to make sure there is nothing else that needs to be done for her hiatal hernia.  Tells me that at some point she was having increased shortness of breath and Dr. Marchelle Gearing thought maybe her hiatal hernia was impeding her lungs, but she has been fine over the past 3 to 4 months with no further shortness of breath.    Up-to-date with colon cancer screening, 2 prior colonoscopies at [facility] and now [facility].    Patient used to work as an Publishing rights manager.    Denies fever, chills, weight loss, nausea, vomiting, heartburn or reflux.  Past Medical History:  Diagnosis Date   Anemia    Hiatal hernia    HTN (hypertension)    (patient denies)   ILD (interstitial lung disease) (HCC)    Insomnia    Neck pain    Paroxysmal A-fib (HCC)     CHADS2vasc =1   Polyarthritis    PONV (postoperative nausea and vomiting)    and had a siezure once at dentist    Seasonal allergies    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   Typical atrial flutter (HCC)    Vasovagal syncope 04/28/2013    Past Surgical History:  Procedure Laterality Date   CARDIOVERSION N/A 04/22/2015   Procedure: CARDIOVERSION;  Surgeon: Quintella Reichert, MD;  Location: [facility] ENDOSCOPY;  Service: Cardiovascular;  Laterality: N/A;   KNEE SURGERY     TEE WITHOUT CARDIOVERSION N/A 04/22/2015   Procedure: TRANSESOPHAGEAL ECHOCARDIOGRAM (TEE);  Surgeon: Quintella Reichert, MD;  Location: [facility] ENDOSCOPY;  Service: Cardiovascular;  Laterality: N/A;    Current Outpatient Medications  Medication Sig Dispense Refill   acetaminophen (TYLENOL) 500 MG tablet Take 500 mg by mouth every 6 (six) hours as needed (pain).      Coenzyme Q10 (CO Q-10) 400 MG CAPS Take 1 tablet by mouth daily.     escitalopram (LEXAPRO) 10 MG tablet Take 1 tablet (10 mg total) by mouth daily. 90 tablet 3   Ferrous Sulfate (IRON SLOW RELEASE) 142 (45 Fe) MG TBCR Take 1 tablet by mouth daily.     flecainide (TAMBOCOR) 100 MG tablet Take 1 tablet (100 mg total) by mouth 2 (two) times daily. Please keep scheduled appointment  for future refills. Thank you. 180 tablet 0   fluticasone (FLONASE) 50 MCG/ACT nasal spray INSTILL TWO SPRAYS into IN EACH NOSTRIL DAILY 16 g 6   LORATADINE PO Take 1 tablet by mouth daily.     Multiple Vitamins-Minerals (MULTIVITAMIN WITH MINERALS) tablet Take 1 tablet by mouth daily.     Probiotic Product (PROBIOTIC PO) Take 1 tablet by mouth daily.     rosuvastatin (CRESTOR) 20 MG tablet Take 1 tablet (20 mg total) by mouth every other day. 45 tablet 3   SF 5000 PLUS 1.1 % CREA dental cream Take by mouth as directed.     XARELTO 20 MG TABS tablet TAKE 1 TABLET BY MOUTH DAILY WITH SUPPER 90 tablet 3   zolpidem (AMBIEN) 5 MG tablet Take 1 tablet (5 mg total) by mouth at bedtime as needed  for sleep. Put on file 30 tablet 5   albuterol (VENTOLIN HFA) 108 (90 Base) MCG/ACT inhaler Inhale 2 puffs into the lungs every 6 (six) hours as needed for wheezing or shortness of breath. (Patient not taking: Reported on 07/31/2023) 8 g 0   No current facility-administered medications for this visit.    Allergies as of 07/31/2023 - Review Complete 07/31/2023  Allergen Reaction Noted   Penicillins Rash     Family History  Problem Relation Age of Onset   Hodgkin's lymphoma Mother    Heart attack Father 69   Breast cancer Paternal Aunt    Diabetes Maternal Grandfather    Sleep apnea Neg Hx     Social History   Socioeconomic History   Marital status: Widowed    Spouse name: Not on file   Number of children: Not on file   Years of education: Not on file   Highest education level: Not on file  Occupational History   Not on file  Tobacco Use   Smoking status: Never    Passive exposure: Past   Smokeless tobacco: Never  Vaping Use   Vaping status: Never Used  Substance and Sexual Activity   Alcohol use: Yes    Comment: occasionally   Drug use: No   Sexual activity: Not on file  Other Topics Concern   Not on file  Social History Narrative   Not on file   Social Determinants of Health   Financial Resource Strain: Not on file  Food Insecurity: Not on file  Transportation Needs: Not on file  Physical Activity: Not on file  Stress: Not on file  Social Connections: Unknown (02/11/2022)   Received from [facility], [facility]   Social Network    Social Network: Not on file  Intimate Partner Violence: Unknown (01/04/2022)   Received from [employer], [facility]   HITS    Physically Hurt: Not on file    Insult or Talk Down To: Not on file    Threaten Physical Harm: Not on file    Scream or Curse: Not on file    Review of Systems:    Constitutional: No weight loss, fever or chills Skin: No rash  Cardiovascular: No chest pain Respiratory: No  cough Gastrointestinal: See HPI and otherwise negative Genitourinary: No dysuria Neurological: No headache, dizziness or syncope Musculoskeletal: No new muscle or joint pain Hematologic: No bleeding  Psychiatric: No history of depression or anxiety   Physical Exam:  Vital signs: Ht 5\' 7"  (1.702 m) Comment: height measured without shoes  Wt 189 lb 4 oz (85.8 kg)   BMI 29.64 kg/m   Constitutional:  Pleasant elderly Caucasian female appears to be in NAD, Well developed, Well nourished, alert and cooperative Head:  Normocephalic and atraumatic. Eyes:   PEERL, EOMI. No icterus. Conjunctiva pink. Ears:  Normal auditory acuity. Neck:  Supple Throat: Oral cavity and pharynx without inflammation, swelling or lesion.  Respiratory: Respirations even and unlabored. Lungs clear to auscultation bilaterally.   No wheezes, crackles, or rhonchi.  Cardiovascular: Normal S1, S2. No MRG. Regular rate and rhythm. No peripheral edema, cyanosis or pallor.  Gastrointestinal:  Soft, nondistended, nontender. No rebound or guarding. Normal bowel sounds. No appreciable masses or hepatomegaly. Rectal:  Not performed.  Msk:  Symmetrical without gross deformities. Without edema, no deformity or joint abnormality.  Neurologic:  Alert and  oriented x4;  grossly normal neurologically.  Skin:   Dry and intact without significant lesions or rashes. Psychiatric: Demonstrates good judgement and reason without abnormal affect or behaviors.  RELEVANT LABS AND IMAGING: CBC    Component Value Date/Time   WBC 6.3 05/24/2023 1217   RBC 4.51 05/24/2023 1217   HGB 12.8 05/24/2023 1217   HGB 13.0 12/12/2022 1055   HCT 40.1 05/24/2023 1217   HCT 40.6 12/12/2022 1055   PLT 229.0 05/24/2023 1217   PLT 248 12/12/2022 1055   MCV 88.8 05/24/2023 1217   MCV 91 12/12/2022 1055   MCH 29.1 12/12/2022 1055   MCHC 31.8 05/24/2023 1217   RDW 13.7 05/24/2023 1217   RDW 13.3 12/12/2022 1055   LYMPHSABS 2.2 05/24/2023 1217    LYMPHSABS 2.4 03/20/2022 1419   MONOABS 0.6 05/24/2023 1217   EOSABS 0.1 05/24/2023 1217   EOSABS 0.1 03/20/2022 1419   BASOSABS 0.0 05/24/2023 1217   BASOSABS 0.1 03/20/2022 1419    CMP     Component Value Date/Time   NA 139 06/11/2023 1006   K 4.5 06/11/2023 1006   CL 101 06/11/2023 1006   CO2 26 06/11/2023 1006   GLUCOSE 95 06/11/2023 1006   GLUCOSE 86 04/20/2015 1559   BUN 14 06/11/2023 1006   CREATININE 0.74 06/11/2023 1006   CALCIUM 9.5 06/11/2023 1006   PROT 6.4 06/11/2023 1006   ALBUMIN 4.1 06/11/2023 1006   AST 16 06/11/2023 1006   ALT 12 06/11/2023 1006   ALKPHOS 81 06/11/2023 1006   BILITOT 0.2 06/11/2023 1006   GFRNONAA 75 01/15/2020 0959   GFRAA 87 01/15/2020 0959    Assessment: 1.  Hiatal hernia: Initial imaging of CT chest 08/10/2020 showed a moderate-sized hiatal hernia, now described as large on last CT chest 05/20/2023, patient has occasional pill dysphagia only to very large multivitamins but this has occurred over the past 10 to 20 years and has been unchanged, no heartburn or reflux, no chest pain, was initially having some increased shortness of breath that related but over the past 3 to 4 months this is also gone away 2.  Screening for colorectal cancer: Patient is up-to-date with 2 prior colonoscopies and now Cologuard  Plan: 1.  It seems her hiatal hernia is asymptomatic for the most part.  Went ahead and ordered an upper GI study to further evaluate size and if she is having any reflux symptoms.  Pending this evaluation could consider an EGD if needed.  Discussed this with her. 2.  Patient assigned to Dr. Chales Abrahams today.  She will follow in clinic per recommendations after imaging above.  Hyacinth Meeker, PA-C [facility] 07/31/2023, 1:47 PM  Cc: Raliegh Ip, DO

## 2023-08-02 ENCOUNTER — Ambulatory Visit (HOSPITAL_COMMUNITY)
Admission: RE | Admit: 2023-08-02 | Discharge: 2023-08-02 | Disposition: A | Discharge status: Home / Self Care | Payer: Federal, State, Local not specified - PPO | Source: Ambulatory Visit | Attending: Physician Assistant | Admitting: Physician Assistant

## 2023-08-02 DIAGNOSIS — R1319 Other dysphagia: Secondary | ICD-10-CM | POA: Insufficient documentation

## 2023-08-02 DIAGNOSIS — K449 Diaphragmatic hernia without obstruction or gangrene: Secondary | ICD-10-CM | POA: Diagnosis present

## 2023-08-14 ENCOUNTER — Other Ambulatory Visit: Payer: Self-pay | Admitting: *Deleted

## 2023-08-14 DIAGNOSIS — K449 Diaphragmatic hernia without obstruction or gangrene: Secondary | ICD-10-CM

## 2023-08-14 MED ORDER — OMEPRAZOLE 20 MG PO CPDR: 20.0000 mg | DELAYED_RELEASE_CAPSULE | Freq: Every day | ORAL | 3 refills | Status: DC

## 2023-08-22 NOTE — Progress Notes (Signed)
Agree with assessment/plan.  Raj Florestine Carmical, MD [facility] [phone]  

## 2023-09-17 ENCOUNTER — Other Ambulatory Visit: Payer: Self-pay | Admitting: Interventional Cardiology

## 2023-09-26 NOTE — Progress Notes (Signed)
  Cardiology Office Note:  .   Date:  09/26/2023  ID:  Heather Keith, DOB 11-05-1950, MRN [id number] PCP: Raliegh Ip, DO  [facility] Providers Cardiologist:  None {  History of Present Illness: .   Heather Keith is a 72 y.o. female w/PMHx of HTN, AFib, DVT  Saw Dr. Eldridge Dace 04/05/22, reported since COVID in 2022, fairly steady reduction in exertional capacity no longer playing pickle ball, some low BPs and anemia reported. Planned for labs, echo, poss need for pulm evaluation post COVID symptoms  04/10/22: TTE LVEF 65-70%, no WMA, RV OK, no sign VHD, DOE/symptoms not felt to be cardiac  Saw Dr. Ladona Ridgel 08/30/22, minimal symptoms with her AFib, maintained on flecainide Pt reported mild orthostatic symptoms, was off her BB No changes to meds Advised some liberalization with her sodium.   Today's visit is scheduled as an annual visit  ROS:   She feels great! No CP, palpitations or cardiac awareness Plays pickle ball regularly with good exertional capacity No difficulties with ADLs No AFib No SOB, DOE No near syncope or syncope No bleeding or signs of bleeding   Arrhythmia/AAD hx AFib diagnosis goes back perhaps as far as 2000 Flecainide goes back as fas as 2010  Ca++ score zero 2016  Studies Reviewed: Marland Kitchen    EKG done today and reviewed by myself:  SR 77bpm, LAD, PR , QRS 90ms, QTc  08/30/22: SR 73, PR , QRS 98ms, QTc  Risk Assessment/Calculations:    Physical Exam:   VS:  There were no vitals taken for this visit.   Wt Readings from Last 3 Encounters:  07/31/23 189 lb 4 oz (85.8 kg)  06/11/23 188 lb 6.4 oz (85.5 kg)  05/24/23 190 lb 3.2 oz (86.3 kg)    GEN: Well nourished, well developed in no acute distress NECK: No JVD; No carotid bruits CARDIAC: RRR, no murmurs, rubs, gallops RESPIRATORY:   CTA b/l without rales, wheezing or rhonchi  ABDOMEN: Soft, non-tender, non-distended EXTREMITIES:  No edema; No deformity    ASSESSMENT AND PLAN: .    paroxysmal AFib CHA2DS2Vasc is 5, on Xarelto, appropriately dosed Chronic flecainide, maintained OFF her nodal blocker, seems 2/2 BP  No symptoms of AFib/brady  HTN Orthostatic dizziness Not an ongoing complaint off BB  Secondary hypercoagulable state 2/2 AFib    Dispo: back in 6 mo, sooner if needed  Signed, Sheilah Pigeon, PA-C

## 2023-09-27 ENCOUNTER — Encounter: Payer: Self-pay | Admitting: Physician Assistant

## 2023-09-27 ENCOUNTER — Ambulatory Visit: Payer: Federal, State, Local not specified - PPO | Attending: Physician Assistant | Admitting: Physician Assistant

## 2023-09-27 VITALS — BP 114/76 | HR 77 | Ht 68.0 in | Wt 191.0 lb

## 2023-09-27 DIAGNOSIS — I10 Essential (primary) hypertension: Secondary | ICD-10-CM | POA: Diagnosis not present

## 2023-09-27 DIAGNOSIS — I480 Paroxysmal atrial fibrillation: Secondary | ICD-10-CM | POA: Diagnosis not present

## 2023-09-27 DIAGNOSIS — D6869 Other thrombophilia: Secondary | ICD-10-CM

## 2023-09-27 DIAGNOSIS — Z5181 Encounter for therapeutic drug level monitoring: Secondary | ICD-10-CM

## 2023-09-27 DIAGNOSIS — Z79899 Other long term (current) drug therapy: Secondary | ICD-10-CM

## 2023-09-27 NOTE — Patient Instructions (Signed)
Medication Instructions:   Your physician recommends that you continue on your current medications as directed. Please refer to the Current Medication list given to you today.    *If you need a refill on your cardiac medications before your next appointment, please call your pharmacy*   Lab Work: NONE ORDERED  TODAY   If you have labs (blood work) drawn today and your tests are completely normal, you will receive your results only by: MyChart Message (if you have MyChart) OR A paper copy in the mail If you have any lab test that is abnormal or we need to change your treatment, we will call you to review the results.   Testing/Procedures: NONE ORDERED  TODAY     Follow-Up: At [facility], you and your health needs are our priority.  As part of our continuing mission to provide you with exceptional heart care, we have created designated Provider Care Teams.  These Care Teams include your primary Cardiologist (physician) and Advanced Practice Providers (APPs -  Physician Assistants and Nurse Practitioners) who all work together to provide you with the care you need, when you need it.  We recommend signing up for the patient portal called "MyChart".  Sign up information is provided on this After Visit Summary.  MyChart is used to connect with patients for Virtual Visits (Telemedicine).  Patients are able to view lab/test results, encounter notes, upcoming appointments, etc.  Non-urgent messages can be sent to your provider as well.   To learn more about what you can do with MyChart, go to [email or web address].    Your next appointment:   6 month(s)  Provider:   Lewayne Bunting, MD    Other Instructions

## 2023-12-04 ENCOUNTER — Other Ambulatory Visit: Payer: Self-pay | Admitting: Family Medicine

## 2023-12-04 DIAGNOSIS — F5101 Primary insomnia: Secondary | ICD-10-CM

## 2023-12-09 ENCOUNTER — Ambulatory Visit: Payer: Federal, State, Local not specified - PPO | Admitting: Family Medicine

## 2023-12-09 ENCOUNTER — Encounter: Payer: Self-pay | Admitting: Family Medicine

## 2023-12-09 VITALS — BP 99/62 | HR 75 | Temp 98.2°F | Ht 68.0 in | Wt 183.0 lb

## 2023-12-09 DIAGNOSIS — R0989 Other specified symptoms and signs involving the circulatory and respiratory systems: Secondary | ICD-10-CM

## 2023-12-09 DIAGNOSIS — I480 Paroxysmal atrial fibrillation: Secondary | ICD-10-CM

## 2023-12-09 DIAGNOSIS — M7122 Synovial cyst of popliteal space [Baker], left knee: Secondary | ICD-10-CM | POA: Diagnosis not present

## 2023-12-09 DIAGNOSIS — Z7901 Long term (current) use of anticoagulants: Secondary | ICD-10-CM | POA: Diagnosis not present

## 2023-12-09 DIAGNOSIS — R4589 Other symptoms and signs involving emotional state: Secondary | ICD-10-CM

## 2023-12-09 DIAGNOSIS — K449 Diaphragmatic hernia without obstruction or gangrene: Secondary | ICD-10-CM | POA: Diagnosis not present

## 2023-12-09 DIAGNOSIS — E782 Mixed hyperlipidemia: Secondary | ICD-10-CM

## 2023-12-09 DIAGNOSIS — F5101 Primary insomnia: Secondary | ICD-10-CM

## 2023-12-09 MED ORDER — ZOLPIDEM TARTRATE 5 MG PO TABS
5.0000 mg | ORAL_TABLET | Freq: Every evening | ORAL | 5 refills | Status: AC | PRN
Start: 2023-12-12 — End: ?

## 2023-12-09 MED ORDER — OMEPRAZOLE 20 MG PO CPDR: 20.0000 mg | DELAYED_RELEASE_CAPSULE | Freq: Every day | ORAL | 3 refills | Status: DC

## 2023-12-09 MED ORDER — ROSUVASTATIN CALCIUM 20 MG PO TABS: 20.0000 mg | ORAL_TABLET | ORAL | 3 refills | Status: DC

## 2023-12-09 MED ORDER — RIVAROXABAN 20 MG PO TABS: 20.0000 mg | ORAL_TABLET | Freq: Every day | ORAL | 3 refills | Status: DC

## 2023-12-09 MED ORDER — ESCITALOPRAM OXALATE 10 MG PO TABS: 10.0000 mg | ORAL_TABLET | Freq: Every day | ORAL | 3 refills | Status: DC

## 2023-12-09 NOTE — Progress Notes (Signed)
 Subjective: CC: Lump PCP: Raliegh Ip, DO Heather Keith is a 73 y.o. female presenting to clinic today for:  1.  Knee lump, DOE She reports that she has had a couple week history of left posterior knee fullness and tightness.  She denies any preceding injury but notes it sometimes hard to flex and extend that knee.  She is worried that this was a blood clot but reports no missed doses of Xarelto.  She is trying to increase her physical activity and has been playing more pickleball but does sometimes still have to sit down and rest after a couple of rounds.    She has seen gastroenterology and pulmonology but nobody really can figure out why she has dyspnea on exertion.  Overall she does feel like it has gotten slightly better and again has been able to engage in the physical activities that she used to enjoy.  However she still not totally back to her baseline.  She was told that she had a fairly large hiatal hernia but it does not sound like gastroenterology felt this was causing her dyspnea.  She is not sure if she should continue following up with gastroenterology and/or pulmonology and wanted my advice on this today.   ROS: Per HPI  Allergies  Allergen Reactions   Penicillins Rash   Past Medical History:  Diagnosis Date   Anemia    Hiatal hernia    HTN (hypertension)    (patient denies)   ILD (interstitial lung disease) (HCC)    Insomnia    Long COVID    Neck pain    Paroxysmal A-fib (HCC)    CHADS2vasc =1   Polyarthritis    PONV (postoperative nausea and vomiting)    and had a siezure once at dentist    Seasonal allergies    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   Typical atrial flutter (HCC)    Vasovagal syncope 04/28/2013    Current Outpatient Medications:    acetaminophen (TYLENOL) 500 MG tablet, Take 500 mg by mouth every 6 (six) hours as needed (pain). , Disp: , Rfl:    Coenzyme Q10 (CO Q-10) 400 MG CAPS, Take 1 tablet by mouth daily.,  Disp: , Rfl:    escitalopram (LEXAPRO) 10 MG tablet, Take 1 tablet (10 mg total) by mouth daily., Disp: 90 tablet, Rfl: 3   Ferrous Sulfate (IRON SLOW RELEASE) 142 (45 Fe) MG TBCR, Take 1 tablet by mouth daily., Disp: , Rfl:    flecainide (TAMBOCOR) 100 MG tablet, TAKE 1 TABLET BY MOUTH TWICE DAILY, Disp: 180 tablet, Rfl: 0   fluticasone (FLONASE) 50 MCG/ACT nasal spray, INSTILL TWO SPRAYS into IN EACH NOSTRIL DAILY, Disp: 16 g, Rfl: 6   LORATADINE PO, Take 1 tablet by mouth daily., Disp: , Rfl:    Multiple Vitamins-Minerals (MULTIVITAMIN WITH MINERALS) tablet, Take 1 tablet by mouth daily., Disp: , Rfl:    omeprazole (PRILOSEC) 20 MG capsule, Take 1 capsule (20 mg total) by mouth daily., Disp: 90 capsule, Rfl: 3   Probiotic Product (PROBIOTIC PO), Take 1 tablet by mouth daily., Disp: , Rfl:    rosuvastatin (CRESTOR) 20 MG tablet, Take 1 tablet (20 mg total) by mouth every other day., Disp: 45 tablet, Rfl: 3   SF 5000 PLUS 1.1 % CREA dental cream, Take by mouth as directed., Disp: , Rfl:    XARELTO 20 MG TABS tablet, TAKE 1 TABLET BY MOUTH DAILY WITH SUPPER, Disp: 90 tablet, Rfl: 3  zolpidem (AMBIEN) 5 MG tablet, Take 1 tablet (5 mg total) by mouth at bedtime as needed for sleep. Put on file, Disp: 30 tablet, Rfl: 5 Social History   Socioeconomic History   Marital status: Widowed    Spouse name: Not on file   Number of children: 1   Years of education: Not on file   Highest education level: Associate degree: occupational, Scientist, product/process development, or vocational program  Occupational History   Occupation: retired  Tobacco Use   Smoking status: Never    Passive exposure: Past   Smokeless tobacco: Never  Vaping Use   Vaping status: Never Used  Substance and Sexual Activity   Alcohol use: Yes    Comment: occasionally when going out to eat   Drug use: No   Sexual activity: Not on file  Other Topics Concern   Not on file  Social History Narrative   Not on file   Social Drivers of Health    Financial Resource Strain: Low Risk  (12/05/2023)   Overall Financial Resource Strain (CARDIA)    Difficulty of Paying Living Expenses: Not hard at all  Food Insecurity: No Food Insecurity (12/05/2023)   Hunger Vital Sign    Worried About Running Out of Food in the Last Year: Never true    Ran Out of Food in the Last Year: Never true  Transportation Needs: No Transportation Needs (12/05/2023)   PRAPARE - Administrator, Civil Service (Medical): No    Lack of Transportation (Non-Medical): No  Physical Activity: Sufficiently Active (12/05/2023)   Exercise Vital Sign    Days of Exercise per Week: 3 days    Minutes of Exercise per Session: 90 min  Stress: No Stress Concern Present (12/05/2023)   [employer] of Occupational Health - Occupational Stress Questionnaire    Feeling of Stress : Not at all  Social Connections: Unknown (12/05/2023)   Social Connection and Isolation Panel [NHANES]    Frequency of Communication with Friends and Family: More than three times a week    Frequency of Social Gatherings with Friends and Family: More than three times a week    Attends Religious Services: Patient declined    Database administrator or Organizations: No    Attends Banker Meetings: Not on file    Marital Status: Widowed  Intimate Partner Violence: Unknown (01/04/2022)   Received from [facility], [facility]   HITS    Physically Hurt: Not on file    Insult or Talk Down To: Not on file    Threaten Physical Harm: Not on file    Scream or Curse: Not on file   Family History  Problem Relation Age of Onset   Hodgkin's lymphoma Mother    Heart attack Father 71   Diabetes Maternal Grandfather    Breast cancer Paternal Aunt    Colon cancer Paternal Aunt    Sleep apnea Neg Hx     Objective: Office vital signs reviewed. BP 99/62   Pulse 75   Temp 98.2 F (36.8 C)   Ht 5\' 8"  (1.727 m)   Wt 183 lb (83 kg)   SpO2 97%   BMI 27.83 kg/m   Physical Examination:   General: Awake, alert, well nourished, No acute distress HEENT: Sclera white.  Moist mucous membranes.  No exophthalmos.  Oropharynx with minimal erythema but no ulcerations or exudates. Cardio: regular rate and rhythm, S1S2 heard, no murmurs appreciated Pulm: clear to auscultation bilaterally, no wheezes, rhonchi or  rales; normal work of breathing on room air MSK: Palpable fluctuant fullness noted in the posterior popliteal fossa.  She is able to extend the knee without difficulty.  There is no palpable crepitus.  She is able to flex her knee but not fully.  I do not appreciate any joint effusion or warmth anteriorly at the joint line  Assessment/ Plan: 73 y.o. female   Engineer, production cyst, left  Throat clearing  Hiatal hernia - Plan: omeprazole (PRILOSEC) 20 MG capsule  Chronic anticoagulation - Plan: CBC, Iron, TIBC and Ferritin Panel  Paroxysmal atrial fibrillation (HCC) - Plan: Basic Metabolic Panel, CBC, Iron, TIBC and Ferritin Panel, Magnesium, TSH, rivaroxaban (XARELTO) 20 MG TABS tablet  Primary insomnia - Plan: zolpidem (AMBIEN) 5 MG tablet  Moodiness - Plan: escitalopram (LEXAPRO) 10 MG tablet  Mixed hyperlipidemia - Plan: rosuvastatin (CRESTOR) 20 MG tablet  Lesion of concern is consistent with a Baker's cyst.  Discussed consideration for eval with orthopedics to have potentially have this drained.  She does have an orthopedist and will contact them.  Handout provided  Throat clearing may be resultant of uncontrolled allergies.  I offered her a nasal spray, Astelin, but she declined this today.  Also to be considered his uncontrolled GERD.  She is on a PPI but given hiatal hernia perhaps she needs a higher dose.  I will order labs today given chronic anticoagulation.  She was both rate and rhythm controlled today.  She will continue Xarelto as prescribed  Ambien renewed for as needed use.  Will need to update UDS and CSC at next visit as it will expire in about 3 weeks.  The  national narcotic database was reviewed and there were no red flags  Did not discuss moodiness or hyperlipidemia but needed refills so both Rx's were sent to last her through until her next physical.  She will schedule annual physical with fasting labs for 89-month follow-up   Heather Capasso Hulen Skains, DO [facility] [phone]

## 2023-12-09 NOTE — Patient Instructions (Signed)
Baker Cyst  A Baker cyst, also called a popliteal cyst, is a growth that forms at the back of the knee. The cyst forms when the fluid-filled sac (bursa) behind the knee joint fills up with more fluid and becomes enlarged. What are the causes? In most cases, a Baker cyst results from another knee problem that causes swelling in the knee. This makes the fluid inside the knee joint (synovial fluid) flow into the bursa behind the knee, causing the bursa to enlarge. The fluid flows in one direction and is unable to move back into the joint. What increases the risk? You may be more likely to develop a Baker cyst if you already have a knee problem, such as: A tear in cartilage that cushions the knee joint (meniscal tear). A tear in the tissues that connect the bones of the knee joint (ligament tear). Knee swelling from osteoarthritis, rheumatoid arthritis, or gout. What are the signs or symptoms? The main symptom of this condition is a lump behind the knee. This may be the only symptom of the condition. The lump may be painful, especially when the knee is straightened. If the lump is painful, the pain may come and go. The knee may also be stiff. Symptoms may quickly get more severe if the cyst breaks open. If the cyst breaks open, you may feel the following in your knee and calf: Sudden or worsening pain. Swelling. Bruising. Redness in the calf. A Baker cyst does not always cause symptoms. How is this diagnosed? This condition may be diagnosed based on your symptoms and medical history. Your health care provider will also do a physical exam. This may include: Feeling the cyst to check whether it is tender. Checking your knee for signs of another knee condition that causes swelling. You may have imaging tests, such as X-ray, ultrasound, or MRI. How is this treated? A Baker cyst that is not painful may go away without treatment. If the cyst gets large or painful, it will likely get better if the  underlying knee problem is treated. If needed, treatment for a Baker cyst may include: Resting. Keeping weight off the knee. This means not leaning on the knee to support your body weight. Taking NSAIDs, such as ibuprofen, to reduce pain and swelling. Draining the fluid from the cyst with a needle (aspiration). Getting a steroid injection into the knee. A steroid reduces swelling. Surgery. This may be needed if other treatments do not work. This usually involves correcting knee damage and removing the cyst. In some cases, you may also need to do certain exercises to improve movement in the knee (physical therapy). Follow these instructions at home: Activity Rest as told by your provider. Avoid activities that make pain or swelling worse. Do not use the injured limb to support your body weight until your provider says that you can. Use crutches as told by your provider. Raise (elevate) your knee above the level of your heart while you are sitting or lying down. Do physical therapy exercises as told by your provider. Return to your normal activities as told by your provider. Ask your provider what activities are safe for you. General instructions Take over-the-counter and prescription medicines only as told by your provider. Keep all follow-up visits. Your provider will monitor your healing and adjust your treatment as needed. Contact a health care provider if: You have knee pain, stiffness, or swelling that does not get better. Get help right away if: You have sudden or worsening pain and   swelling in your calf area. This information is not intended to replace advice given to you by your health care provider. Make sure you discuss any questions you have with your health care provider. Document Revised: 05/16/2022 Document Reviewed: 05/16/2022 [employer] Patient Education  2024 [employer]  

## 2023-12-10 ENCOUNTER — Encounter: Payer: Self-pay | Admitting: Family Medicine

## 2023-12-10 LAB — CBC
Hematocrit: 44.2 % (ref 34.0–46.6)
Hemoglobin: 14 g/dL (ref 11.1–15.9)
MCH: 28.2 pg (ref 26.6–33.0)
MCHC: 31.7 g/dL (ref 31.5–35.7)
MCV: 89 fL (ref 79–97)
Platelets: 210 10*3/uL (ref 150–450)
RBC: 4.96 x10E6/uL (ref 3.77–5.28)
RDW: 13 % (ref 11.7–15.4)
WBC: 4.9 10*3/uL (ref 3.4–10.8)

## 2023-12-10 LAB — BASIC METABOLIC PANEL
BUN/Creatinine Ratio: 21 (ref 12–28)
BUN: 18 mg/dL (ref 8–27)
CO2: 24 mmol/L (ref 20–29)
Calcium: 9.8 mg/dL (ref 8.7–10.3)
Chloride: 103 mmol/L (ref 96–106)
Creatinine, Ser: 0.85 mg/dL (ref 0.57–1.00)
Glucose: 96 mg/dL (ref 70–99)
Potassium: 5 mmol/L (ref 3.5–5.2)
Sodium: 140 mmol/L (ref 134–144)
eGFR: 72 mL/min/{1.73_m2} (ref >59)

## 2023-12-10 LAB — IRON,TIBC AND FERRITIN PANEL
Ferritin: 97 ng/mL (ref 15–150)
Iron Saturation: 36 % (ref 15–55)
Iron: 95 ug/dL (ref 27–139)
Total Iron Binding Capacity: 266 ug/dL (ref 250–450)
UIBC: 171 ug/dL (ref 118–369)

## 2023-12-10 LAB — MAGNESIUM: Magnesium: 2 mg/dL (ref 1.6–2.3)

## 2023-12-10 LAB — TSH: TSH: 2.11 u[IU]/mL (ref 0.450–4.500)

## 2023-12-17 ENCOUNTER — Other Ambulatory Visit: Payer: Self-pay | Admitting: Internal Medicine

## 2024-01-24 ENCOUNTER — Telehealth: Payer: Self-pay | Admitting: Internal Medicine

## 2024-01-24 NOTE — Telephone Encounter (Signed)
 Pt is scheduled for Monday 01/27/24 with Dr. Bertrum Brodie. She was to have CT Chest before ov. CT not scheduled until 02/12/24. Will need to get her rescheduled preferably after scan. I called her to discuss- Left detailed msg to call back.

## 2024-01-27 ENCOUNTER — Ambulatory Visit: Payer: Federal, State, Local not specified - PPO | Admitting: Internal Medicine

## 2024-02-12 ENCOUNTER — Ambulatory Visit (HOSPITAL_BASED_OUTPATIENT_CLINIC_OR_DEPARTMENT_OTHER)
Admission: RE | Admit: 2024-02-12 | Discharge: 2024-02-12 | Disposition: A | Discharge status: Home / Self Care | Payer: Federal, State, Local not specified - PPO | Source: Ambulatory Visit | Attending: Internal Medicine | Admitting: Internal Medicine

## 2024-02-12 DIAGNOSIS — R918 Other nonspecific abnormal finding of lung field: Secondary | ICD-10-CM | POA: Insufficient documentation

## 2024-03-03 ENCOUNTER — Other Ambulatory Visit (HOSPITAL_COMMUNITY): Payer: Self-pay | Admitting: Sports Medicine

## 2024-03-03 DIAGNOSIS — M25561 Pain in right knee: Secondary | ICD-10-CM

## 2024-03-10 ENCOUNTER — Ambulatory Visit (HOSPITAL_COMMUNITY)
Admission: RE | Admit: 2024-03-10 | Discharge: 2024-03-10 | Disposition: A | Discharge status: Home / Self Care | Source: Ambulatory Visit | Attending: Sports Medicine | Admitting: Sports Medicine

## 2024-03-10 DIAGNOSIS — M25561 Pain in right knee: Secondary | ICD-10-CM | POA: Diagnosis not present

## 2024-03-12 ENCOUNTER — Encounter: Payer: Self-pay | Admitting: Internal Medicine

## 2024-03-12 ENCOUNTER — Ambulatory Visit: Admitting: Internal Medicine

## 2024-03-12 VITALS — BP 106/64 | HR 67 | Ht 68.0 in | Wt 182.0 lb

## 2024-03-12 DIAGNOSIS — R918 Other nonspecific abnormal finding of lung field: Secondary | ICD-10-CM

## 2024-03-12 NOTE — Progress Notes (Signed)
 OV 07/24/2022 -presents to see Dr. Bertrum Brodie at the ILD center.   Subjective:  Patient ID: Heather Keith, female , DOB: Nov 08, 1950 , age 73 y.o. , MRN: [id number] , ADDRESS: [address] [place] [address] PCP Eliodoro Guerin, DO Patient Care Team: Eliodoro Guerin, DO as PCP - General (Family Medicine) Tammie Fall, MD as Consulting Physician (Cardiology)  This Provider for this visit: Treatment Team:  Attending Provider: Maire Scot, MD    07/24/2022 -   Chief Complaint  Patient presents with   Consult    PT states since Covid in 2022, chronic DOE Pulmonary Fibrosis on CT 05/2022     HPI Heather Keith 73 y.o. -presents for an ILD evaluation.  She is the first cousin of the late Mr. Adriana Hopping who is my patient and died from pulmonary fibrosis secondary to chronic hypersensitive pneumonitis.  She presents with his wife because she has concerns about ILD herself.   She is known to have moderate-sized to large sized hiatal hernia.  She also has lung nodules.  She had a CT scan without contrast in November 2021 during which time ILD was not reported but the hiatal hernia was reported and stability in lung nodules was noted.  [facility] personal visualization does some basal infiltrates = after the patient left second opinion from thoracic radiologist this is more consistent with atelectasis.  It appears in January 2022 she had COVID-19.  Possibly sometime after that she started noticing shortness of breath for the last year and a half.  In the summer 2022 she did have cardiopulmonary stress test and echo and these were normal [see results below] recent diagnosis of anemia [see below] for she started iron supplementation and since then her shortness of breath is actually better.  Her current symptom score is listed below.  She also has a cough since July 2023.  The cough is also getting better.  Shortness of breath is associated with dizziness also started 1 and  half years ago and can happen during the day.  It gets better when she lies down.   [place] [facility] Comprehensive ILD Questionnaire  Symptoms:     Past Medical History :  Moderate to large size hiatal hernia seen on her CT scan - Pulmonary nodules being followed - Remote history of infectious mononucleosis several decades ago -She had COVID in January 2022 and again in August 2023.  Both treated as outpatient - She has atrial fibrillation, hyperlipidemia, osteoarthritis   ROS:  Positive for fatigue for the last few years. - Positive for arthralgia - Does have dysphagia for let us  - She does have dry eyes.  No saliva production since COVID in 2022. - She is lost 25 pounds of weight with the help of weight loss medication Wegovy  - She does feel tired all the time and feels sleepy during the daytime not evaluated for sleep apnea  FAMILY HISTORY of LUNG DISEASE:  -Her first cousin was Mr. 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  Age of the home is 42 years. -In the past there was some mold in  the curtain but she changes the court now every 6 months. 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  There was dust with a mail = While she worked as a Radiation protection practitioner there was exposure to formaldehyde and other chemicals such as [place]  PULMONARY TOXICITY HISTORY (27 items):  denies  INVESTIGATIONS: below    CT Chest data  No results found.    PFT  ANA - neg July 2022  D-dimer 6.5 in July 2022   CPEX Aug 2022:  -  CPX:  Exercise testing with gas exchange demonstrates a normal peak VO2 of 16.7 ml/kg/min (104% of the age/gender/weight matched sedentary norms). The RER of 1.10 indicates a maximal effort. When adjusted to the patient's ideal body weight of 124 lb (56.3 kg) the peak VO2 is 26.51ml/kg (ibw)/min (123% of the ibw-adjusted predicted). The VE/VCO2 slope is normal. The oxygen uptake efficiency slope (OUES) is normal. The VO2 at the ventilatory threshold was normal at 81% of the predicted peak VO2. At peak exercise, the ventilation reached 51% of the measured MVV indicating ventilatory reserve remained. The O2pulse (a surrogate for stroke volume) increased with initial exercise, with little to no increase beyond initial exercise, at a peak of 12 ml/beat (134% predicted).    Conclusion: Exercise testing with gas exchange demonstrates normal functional capacity when compared to matched sedentary norms. There is no evidence for cardiopulmonary abnormality. There was mild chronotropic incompetence.    CT chest without contrast (not a HRCT - sept 2023 IMPRESSION: 1. No acute intrathoracic process. 2. Scattered subcentimeter partially calcified pulmonary nodules, demonstrating long-term stability and consistent with benign postinflammatory or postinfectious  change. 3. Progressive subpleural scarring/fibrosis, greatest in the mid to lower lung zones. Pattern can be seen with UIP/IPF. If further evaluation is desired, high-resolution chest CT could be considered. -> Was not reporte din 2021 CT but in my personal professional opon was present in 2021 scan  4. Stable moderate hiatal hernia. 5.  Aortic Atherosclerosis (ICD10-I70.0).     Electronically Signed   By: Bobbye Burrow M.D.   On: 06/16/2022 16:04   ECHO summer 2023  -  Normal     11/02/2022: Today - follow up Patient presents today for follow up after PFTs. After her last visit, thoracic radiologist reviewed her imaging and was unsure if she actually had ILD. She does have risk factors so was encouraged to continue with workup and plan for HRCT in the summer 2024. She did complete her autoimmune panel workup. She has not heard results from these. Quantiferon TB was indeterminate. She denies any known exposure to TB, illicit drug use, close quarter living, or  travel to areas with high rates of TB. She denies night sweats, weight loss, fevers, hemoptysis. She also had a positive rheumatoid factor. She does tell me she has joint pains and occasionally swelling/redness. Mainly right knee, both shoulders, and left thumb. She does not think she has ever had workup for RA. She has had joint injections to her thumb before, which helps. 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  Regarding her breathing, she tells me that she actually feels better since she was here last. She has been undergoing treatment for her anemia and feels like this has made a huge difference. She has been playing pickle ball and exercising without difficulty. This is usually the only time she notices her shortness of breath and feels like it's relatively normal for the strenuous activity she's doing. She still has an occasional dry cough and throat clearing; attributes this to postnasal drainage. Otherwise aside  from her joint pains, she is feeling well.  Her pulmonary function testing today revealed normal spirometry and lung volumes. She did have a moderate diffusion defect but this was not corrected for hgb. She has not had recent CBC drawn.    OV 05/24/2023  Subjective:  Patient ID: Heather Keith, female , DOB: [birth year] , age 52 y.o. , MRN: [id number] , ADDRESS: [address] [place] [address] PCP Eliodoro Guerin, DO Patient Care Team: Eliodoro Guerin, DO as PCP - General (Family Medicine) Tammie Fall, MD as Consulting Physician (Cardiology)  This Provider for this visit: Treatment Team:  Attending Provider: Maire Scot, MD    05/24/2023 -   Chief Complaint  Patient presents with   Follow-up    F/up on CT scan     HPI Monifah S Gerdts 72 y.o. -returns for follow-up.  I originally saw her 10 months ago because of concern of ILD in the setting of her cousin having hypersensitive pneumonitis and also she herself having COVID in 2022.  My first visit with her was in October 2023.  The radiologist thought there was ILD but I was not so sure.  She also had multiple pulmonary nodules.  Subsequently she is followed up with a pulmonary function test that is near normal/normal.  She did see Katie Cobb by this time the dyspnea was better.  She tells me now that the dyspnea continues to be better.  See the symptom score below and see this improvement in dyspnea and fatigue.  She is still mainly concerned about fatigue that started after the COVID and is still not fully better despite her playing pickle ball.  She is wondering about other causes.  She had a follow-up high-resolution CT chest that I personally visualized.  I do not see any ILD again.  There is no ILD either.  However there are some lung nodules.  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  Patient only has seasonal allergies she admitted but no real other asthma  or wheezing symptoms.  No paroxysmal nocturnal dyspnea no nocturnal symptoms. Interim Health status: No new complaints No new medical problems. No new surgeries. No ER visits. No Urgent care visits. No changes to medications  The main concern she still has is that post-COVID residual fatigue.  She she now reports a previous history of mold exposure in the bathroom.  She says when her daughter was in [place]  came back the daughter also started feeling unwell.  Given the  seasonal allergies and the CT findings and the previous history of mold exposure [currently none] she is agreed to getting allergy testing done.   Of note: She hiatal hernia this was incidentally diagnosed during CT scans.  She has been aware of it for a few years.  She is never seen GI never seen surgery no active acid reflux.  She does admit to a large pill dysphagia especially large vitamin pills for over 10 or 20 years.     CT Chest data from date: HRCT 05/13/23  - personally visualized and independently interpreted : yes - my findings are: no ILD but agree with nodules Narrative & Impression  CLINICAL DATA:  Follow-up interstitial lung disease.   EXAM: CT CHEST WITHOUT CONTRAST   TECHNIQUE: Multidetector CT imaging of the chest was performed following the standard protocol without intravenous contrast. High resolution imaging of the lungs, as well as inspiratory and expiratory imaging, was performed.   RADIATION DOSE REDUCTION: This exam was performed according to the departmental dose-optimization program which includes automated exposure control, adjustment of the mA and/or kV according to patient size and/or use of iterative reconstruction technique.   COMPARISON:  06/14/2022 chest CT.   FINDINGS: Cardiovascular: Normal heart size. No significant pericardial effusion/thickening. Atherosclerotic nonaneurysmal thoracic aorta. Normal caliber pulmonary arteries.   Mediastinum/Nodes: No significant thyroid   nodules. Unremarkable esophagus. No pathologically enlarged axillary, mediastinal or hilar lymph nodes, noting limited sensitivity for the detection of hilar adenopathy on this noncontrast study.   Lungs/Pleura: No pneumothorax. No pleural effusion. Multiple branching tubular foci of consolidation throughout the peripheral lungs bilaterally, increased in size and number, for example increased in the posterior peripheral left upper lobe to 1.7 x 1.0 cm (series 7/image 71) from 1.0 x 0.6 cm on 06/14/2022 CT and largely new in the posterior peripheral right upper lobe spanning up to 2.2 x 0.9 cm (series 7/image 50). Mild patchy tree-in-bud opacities are associated with these dominant tubular branching opacities. A branching tubular 1.0 x 0.7 cm opacity in the peripheral right lower lobe is similar size with increased central cavitary change. Several additional scattered solid pulmonary nodules are stable, for example 0.6 cm in the posteromedial left lower lobe (series 7/image 99). Minimal patchy reticulation and ground-glass opacity in the dependent lungs is unchanged. No significant regions of architectural distortion or frank honeycombing. No significant lobular air trapping or evidence of tracheobronchomalacia.   Upper abdomen: Large hiatal hernia.  Stomach is nondistended.   Musculoskeletal: No aggressive appearing focal osseous lesions. Moderate thoracic spondylosis.   IMPRESSION: 1. Multiple branching tubular foci of consolidation throughout the peripheral lungs bilaterally, increased in size and number since 06/14/2022 CT, with associated mild patchy tree-in-bud opacities. Leading considerations include allergic bronchopulmonary aspergillosis (ABPA) or chronic atypical mycobacterial infection (MAI). Suggest post treatment high-resolution chest CT follow-up in 6 months. 2. Large hiatal hernia. 3.  Aortic Atherosclerosis (ICD10-I70.0).     Electronically Signed   By: Levell Reach M.D.   On: 05/20/2023 08:26    OV 03/12/2024  Subjective:  Patient ID: Heather Keith, female , DOB: Jun 01, 1951 , age 56 y.o. , MRN: [id number] , ADDRESS: [address] [place] [address] PCP Eliodoro Guerin, DO Patient Care Team: Eliodoro Guerin, DO as PCP - General (Family Medicine) Tammie Fall, MD as Consulting Physician (Cardiology)  This Provider for this visit: Treatment Team:  Attending Provider: Maire Scot, MD    03/12/2024 -   Chief Complaint  Patient presents with  Follow-up     HPI Heather Keith 73 y.o. -returns for follow-up.  She has a significant hiatal hernia but no overt acid reflux because of PPIs.  I saw her in the summer 2024 because of his concerns of ILD but his CT findings were more consistent with ABPA and tree-in-bud infiltrates.  She was already beginning to feel better at that time.  Therefore we decided to do a follow-up CT scan now.  In the current CT scan that I personally visualized agree with the radiologist that all inflammatory findings from August 2024 have resolved.  However in my personal visualization opinion there is interstitial lung abnormalities in the lower lobe [ILA].  She does have significant hiatal hernia.  At this point in time she feels well with no shortness of breath.  Is able to play pickle ball [although has recently injured her right knee and had MRI and I gave the report to her].  There is no dyspnea while playing pickle ball.   SYMPTOM SCALE - ILD 07/25/2022 05/24/2023  03/12/2024   Current weight     O2 use ra ra ra  Shortness of Breath 0 -> 5 scale with 5 being worst (score 6 If unable to do)    At rest 0 0 0  Simple tasks - showers, clothes change, eating, shaving 3 0 0  Household (dishes, doing bed, laundry) 3 2 00  Shopping 3 0 0  Walking level at own pace 3 1 0  Walking up Stairs 4 2 1   Total (30-36) Dyspnea Score 16 5 1       Non-dyspnea symptoms (0-> 5 scale) 07/25/2022  05/24/2023  03/12/2024   How bad is your cough? moderate 0 3  How bad is your fatigue Moderat to bad 2 1  How bad is nausea Not bad 0 0  How bad is vomiting?  Not bad 0 0  How bad is diarrhea? occ 0 0  How bad is anxiety? not 0 00  How bad is depression not 0   Any chronic pain - if so where and how bad Moderate knee pain  00    Simple office walk 224 (66+46 x 2) feet Pod A at [facility] x  3 laps goal with forehead probe 05/24/2023    O2 used ra   Number laps completed Sit stand x 15   Comments about pace x   Resting Pulse Ox/HR 98% and 65/min   Final Pulse Ox/HR 95% and 78/min   Desaturated </= 88% no   Desaturated <= 3% points yes   Got Tachycardic >/= 90/min no   Symptoms at end of test x   Miscellaneous comments x     CT Chest data from date: Feb 12 2024  - personally visualized and independently interpreted : yes - my findings are: SHE HAS ILA Narrative & Impression  CLINICAL DATA:  Lung nodule, follow-up examination   EXAM: CT CHEST WITHOUT CONTRAST   TECHNIQUE: Multidetector CT imaging of the chest was performed following the standard protocol without IV contrast.   RADIATION DOSE REDUCTION: This exam was performed according to the departmental dose-optimization program which includes automated exposure control, adjustment of the mA and/or kV according to patient size and/or use of iterative reconstruction technique.   COMPARISON:  None Available.   FINDINGS: Cardiovascular: No significant coronary artery calcification. Global cardiac size within normal limits. No pericardial effusion. Central pulmonary arteries are of normal caliber. Mild atherosclerotic calcification within the thoracic aorta. No aortic  aneurysm.   Mediastinum/Nodes: Visualized thyroid  is unremarkable. No pathologic thoracic adenopathy. Esophagus unremarkable. Large hiatal hernia   Lungs/Pleura: Scattered areas of airway impaction are again seen and have resolved within the right upper  lobe and have improved within the superior lingula, and lower lobes bilaterally. Stable 3 mm noncalcified pulmonary nodule within the left upper lobe, axial image # 85/3. No new focal pulmonary nodules or infiltrates are identified. No pneumothorax or pleural effusion. No central obstructing lesion.   Upper Abdomen: No acute abnormality.   Musculoskeletal: No chest wall mass or suspicious bone lesions identified.   IMPRESSION: 1. Stable 3 mm noncalcified pulmonary nodule within the left upper lobe, benign. No further follow-up is required. 2. Scattered areas of airway impaction are again seen and have resolved within the right upper lobe and have improved within the superior lingula, and lower lobes bilaterally. Findings are most consistent with a resolving chronic infectious or inflammatory process. No new focal pulmonary nodules or infiltrates are identified. 3. Large hiatal hernia.   Aortic Atherosclerosis (ICD10-I70.0).     Electronically Signed   By: Worthy Heads M.D.   On: 02/19/2024 05:10    Latest Reference Range & Units 05/24/23 12:17  Class Description Allergens  Comment  D Pteronyssinus IgE Class 0 kU/L <0.10  D Farinae IgE Class 0 kU/L <0.10  Cat Dander IgE Class 0 kU/L <0.10  Dog Dander IgE Class 0 kU/L <0.10  Penicillium Chrysogen IgE Class 0 kU/L <0.10  Cladosporium Herbarum IgE Class 0 kU/L <0.10  Aspergillus Fumigatus IgE Class 0 kU/L <0.10  Mucor Racemosus IgE Class 0 kU/L <0.10  Alternaria Alternata IgE Class 0 kU/L <0.10  Stemphylium Herbarum IgE Class 0 kU/L <0.10  Goose Feathers IgE Class 0 kU/L <0.10  Chicken Feathers IgE Class 0 kU/L <0.10  Duck Feathers IgE Class 0 kU/L <0.10  IgE (Immunoglobulin E), Serum <OR=114 kU/L 140 (H)  Mouse Urine IgE Class 0 kU/L <0.10  (H): Data is abnormally high PFT     Latest Ref Rng & Units 11/02/2022    1:32 PM  PFT Results  FVC-Pre L 3.04   FVC-Predicted Pre % 81   FVC-Post L 3.08   FVC-Predicted Post  % 82   Pre FEV1/FVC % % 77   Post FEV1/FCV % % 80   FEV1-Pre L 2.36   FEV1-Predicted Pre % 83   FEV1-Post L 2.47   DLCO uncorrected ml/min/mmHg 17.55   DLCO UNC% % 74   DLCO corrected ml/min/mmHg 17.55   DLCO COR %Predicted % 74   DLVA Predicted % 100   TLC L 5.69   TLC % Predicted % 95   RV % Predicted % 115        LAB RESULTS last 96 hours MR KNEE RIGHT WO CONTRAST Result Date: 03/10/2024 EXAM DESCRIPTION: MR KNEE RIGHT WO CONTRAST CLINICAL HISTORY: Pain. COMPARISON: None Available. TECHNIQUE: MRI of the knee is performed according to our usual protocol with multiplanar multi sequence imaging. FINDINGS: The anterior cruciate ligament and posterior cruciate ligament are intact. The medial collateral ligament and lateral collateral ligament are intact. The lateral meniscus is unremarkable. Diminutive medial meniscus consistent with chronic degenerative injury and/or prior meniscectomy. Mild myxoid degeneration to the residual body without tear. Mild-to-moderate medial compartment chondromalacia. There is a small full-thickness defect to the medial tibial articular cartilage. Moderate degenerative edema. 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 Large areas of full-thickness defect. Mild degenerative edema. Small joint effusion and small popliteal cyst. Benign-appearing chondroid lesion  in the distal femur consistent with enchondroma measuring up to 2.3 cm. IMPRESSION: Small full-thickness defect of the medial tibial articular cartilage. Moderate underlying degenerative marrow edema. 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 Mild degenerative edema. Diminutive medial meniscus and myxoid degeneration to the body. No tear. Just over 2 cm benign-appearing chondroid lesion in the distal femur. 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   reports that she has never smoked. She has been exposed to tobacco smoke. She has never used smokeless tobacco.  Past Surgical History:  Procedure Laterality Date   CARDIOVERSION N/A 04/22/2015   Procedure: CARDIOVERSION;  Surgeon: Jacqueline Matsu, MD;  Location: [facility] ENDOSCOPY;  Service: Cardiovascular;  Laterality: N/A;   KNEE ARTHROSCOPY Right 2009   PILONIDAL CYST EXCISION  1969   SHOULDER ARTHROSCOPY Right 2021   TEE WITHOUT CARDIOVERSION N/A 04/22/2015   Procedure: TRANSESOPHAGEAL ECHOCARDIOGRAM (TEE);  Surgeon: Jacqueline Matsu, MD;  Location[facility] ENDOSCOPY;  Service: Cardiovascular;  Laterality: N/A;   TOE SURGERY Right    great toe pinning due to a break   TONSILLECTOMY  1990   TUBAL LIGATION  1983    Allergies  Allergen Reactions   Penicillins Rash    Immunization History  Administered Date(s) Administered   Fluad Quad(high Dose 65+) 07/06/2022   Influenza Whole 07/02/2015   Influenza, High Dose Seasonal PF 07/09/2018, 07/15/2019   Influenza-Unspecified 07/13/2020   PPD Test 12/12/2022   Pneumococcal Conjugate-13 07/05/2016   Pneumococcal Polysaccharide-23 10/17/2015, 07/05/2017   Tdap 12/22/1998, 10/17/2015, 04/18/2020   Zoster Recombinant(Shingrix) 07/09/2018, 10/07/2018    Family History  Problem Relation Age of Onset   Hodgkin's lymphoma Mother    Heart attack Father 11   Diabetes Maternal Grandfather    Breast cancer Paternal Aunt    Colon cancer Paternal Aunt    Sleep apnea Neg Hx      Current Outpatient Medications:    acetaminophen (TYLENOL) 500 MG tablet, Take 500 mg by mouth every 6 (six) hours as needed  (pain). , Disp: , Rfl:    Coenzyme Q10 (CO Q-10) 400 MG CAPS, Take 1 tablet by mouth daily., Disp: , Rfl:    escitalopram  (LEXAPRO ) 10 MG tablet, Take 1 tablet (10 mg total) by mouth daily., Disp: 90 tablet, Rfl: 3   Ferrous Sulfate (IRON SLOW RELEASE) 142 (45 Fe) MG TBCR, Take 1 tablet by mouth daily., Disp: , Rfl:    flecainide  (TAMBOCOR ) 100 MG tablet, TAKE 1 TABLET BY MOUTH TWICE DAILY, Disp: 180 tablet, Rfl: 2   fluticasone  (FLONASE ) 50 MCG/ACT nasal spray, INSTILL TWO SPRAYS into IN EACH NOSTRIL DAILY, Disp: 16 g, Rfl: 6   loratadine (CLARITIN) 10 MG tablet, Take 10 mg by mouth daily., Disp: , Rfl:    Multiple Vitamins-Minerals (MULTIVITAMIN WITH MINERALS) tablet, Take 1 tablet by mouth daily., Disp: , Rfl:    omeprazole  (PRILOSEC) 20 MG capsule, Take 1 capsule (20 mg total) by mouth daily., Disp: 90 capsule, Rfl: 3   Probiotic Product (PROBIOTIC PO), Take 1 tablet by mouth daily., Disp: , Rfl:    rivaroxaban  (XARELTO ) 20 MG TABS tablet, Take 1  tablet (20 mg total) by mouth daily with supper., Disp: 90 tablet, Rfl: 3   rosuvastatin  (CRESTOR ) 20 MG tablet, Take 1 tablet (20 mg total) by mouth every other day., Disp: 45 tablet, Rfl: 3   SF 5000 PLUS 1.1 % CREA dental cream, Take by mouth as directed., Disp: , Rfl:    zolpidem  (AMBIEN ) 5 MG tablet, Take 1 tablet (5 mg total) by mouth at bedtime as needed for sleep. Put on file, Disp: 30 tablet, Rfl: 5      Objective:   Vitals:   03/12/24 1534  BP: 106/64  Pulse: 67  SpO2: 100%  Weight: 182 lb (82.6 kg)  Height: 5' 8 (1.727 m)    Estimated body mass index is 27.67 kg/m as calculated from the following:   Height as of this encounter: 5' 8 (1.727 m).   Weight as of this encounter: 182 lb (82.6 kg).  @WEIGHTCHANGE @  [employer]   03/12/24 1534  Weight: 182 lb (82.6 kg)     Physical Exam   General: No distress. Looks well O2 at rest: no Cane present: no Sitting in wheel chair: no Frail: no Obese: no Neuro: Alert and  Oriented x 3. GCS 15. Speech normal Psych: Pleasant Resp:  Barrel Chest - no.  Wheeze - no, Crackles - no, No overt respiratory distress CVS: Normal heart sounds. Murmurs - no Ext: Stigmata of Connective Tissue Disease - no HEENT: Normal upper airway. PEERL +. No post nasal drip        Assessment:       ICD-10-CM   1. 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  There are some residual scar with the associated hiatal hernia.  We call this scar interstitial lung abnormalities [ILA].  This is not officially reported but I do believe you have this and this warrants monitoring  -Glad you are able to play pickle ball without any shortness of breath.  Plan - Based on shared decision making we will do a spirometry and DLCO in 1 year -Continue acid reflux control as always.   Follow-up - Return in 1 year with spirometry and DLCO.   FOLLOWUP Return in about 1 year (around 03/12/2025) for 15 min visit, with Dr Bertrum Brodie, after Spiro and DLCO, with any of the APPS.    SIGNATURE    Dr. Maire Scot, M.D., F.C.C.P,  Pulmonary and Critical Care Medicine Staff Physician, [facility] Director - Interstitial Lung Disease  Program  Pulmonary Fibrosis [facility] at [facility] [place], [place], [address]  Pager: [phone], If no answer or between  15:00h - 7:00h: call [phone] Telephone: [phone]  4:33 PM 03/12/2024

## 2024-03-12 NOTE — Patient Instructions (Addendum)
 ICD-10-CM   1. 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  There are some residual scar with the associated hiatal hernia.  We call this scar interstitial lung abnormalities [ILA].  This is not officially reported but I do believe you have this and this warrants monitoring  -Glad you are able to play pickle ball without any shortness of breath.  Plan - Based on shared decision making we will do a spirometry and DLCO in 1 year -Continue acid reflux control as always.   Follow-up - Return in 1 year with spirometry and DLCO.

## 2024-06-03 ENCOUNTER — Other Ambulatory Visit: Payer: Self-pay | Admitting: Family Medicine

## 2024-06-03 DIAGNOSIS — F5101 Primary insomnia: Secondary | ICD-10-CM

## 2024-06-11 ENCOUNTER — Other Ambulatory Visit: Payer: Self-pay | Admitting: Family Medicine

## 2024-06-11 DIAGNOSIS — F5101 Primary insomnia: Secondary | ICD-10-CM

## 2024-06-12 ENCOUNTER — Other Ambulatory Visit: Payer: Self-pay | Admitting: Family Medicine

## 2024-06-12 DIAGNOSIS — Z1231 Encounter for screening mammogram for malignant neoplasm of breast: Secondary | ICD-10-CM

## 2024-06-24 ENCOUNTER — Ambulatory Visit
Admission: RE | Admit: 2024-06-24 | Discharge: 2024-06-24 | Disposition: A | Discharge status: Home / Self Care | Source: Ambulatory Visit | Attending: Family Medicine

## 2024-06-24 DIAGNOSIS — Z1231 Encounter for screening mammogram for malignant neoplasm of breast: Secondary | ICD-10-CM

## 2024-06-26 ENCOUNTER — Other Ambulatory Visit: Payer: Self-pay | Admitting: Family Medicine

## 2024-06-26 DIAGNOSIS — F5101 Primary insomnia: Secondary | ICD-10-CM

## 2024-08-03 ENCOUNTER — Ambulatory Visit

## 2024-08-03 ENCOUNTER — Encounter: Payer: Self-pay | Admitting: Family Medicine

## 2024-08-03 ENCOUNTER — Other Ambulatory Visit: Payer: Self-pay | Admitting: Family Medicine

## 2024-08-03 ENCOUNTER — Ambulatory Visit: Payer: Self-pay | Admitting: Family Medicine

## 2024-08-03 VITALS — BP 87/57 | HR 91 | Temp 97.2°F | Ht 68.25 in | Wt 170.1 lb

## 2024-08-03 DIAGNOSIS — J849 Interstitial pulmonary disease, unspecified: Secondary | ICD-10-CM

## 2024-08-03 DIAGNOSIS — Z780 Asymptomatic menopausal state: Secondary | ICD-10-CM

## 2024-08-03 DIAGNOSIS — I480 Paroxysmal atrial fibrillation: Secondary | ICD-10-CM | POA: Diagnosis not present

## 2024-08-03 DIAGNOSIS — M0580 Other rheumatoid arthritis with rheumatoid factor of unspecified site: Secondary | ICD-10-CM

## 2024-08-03 DIAGNOSIS — Z0001 Encounter for general adult medical examination with abnormal findings: Secondary | ICD-10-CM | POA: Diagnosis not present

## 2024-08-03 DIAGNOSIS — E782 Mixed hyperlipidemia: Secondary | ICD-10-CM

## 2024-08-03 DIAGNOSIS — Z0000 Encounter for general adult medical examination without abnormal findings: Secondary | ICD-10-CM

## 2024-08-03 DIAGNOSIS — M85832 Other specified disorders of bone density and structure, left forearm: Secondary | ICD-10-CM | POA: Diagnosis not present

## 2024-08-03 DIAGNOSIS — R4589 Other symptoms and signs involving emotional state: Secondary | ICD-10-CM

## 2024-08-03 DIAGNOSIS — K449 Diaphragmatic hernia without obstruction or gangrene: Secondary | ICD-10-CM

## 2024-08-03 DIAGNOSIS — Z13820 Encounter for screening for osteoporosis: Secondary | ICD-10-CM

## 2024-08-03 DIAGNOSIS — F5101 Primary insomnia: Secondary | ICD-10-CM

## 2024-08-03 LAB — CBC WITH DIFFERENTIAL/PLATELET

## 2024-08-03 MED ORDER — ESCITALOPRAM OXALATE 10 MG PO TABS: 10.0000 mg | ORAL_TABLET | Freq: Every day | ORAL | 3 refills | Status: AC

## 2024-08-03 MED ORDER — ROSUVASTATIN CALCIUM 20 MG PO TABS: 20.0000 mg | ORAL_TABLET | ORAL | 3 refills | Status: AC

## 2024-08-03 MED ORDER — RIVAROXABAN 20 MG PO TABS: 20.0000 mg | ORAL_TABLET | Freq: Every day | ORAL | 3 refills | Status: AC

## 2024-08-03 MED ORDER — OMEPRAZOLE 20 MG PO CPDR: 20.0000 mg | DELAYED_RELEASE_CAPSULE | Freq: Every day | ORAL | 3 refills | Status: AC

## 2024-08-03 NOTE — Progress Notes (Signed)
 Heather Keith is a 73 y.o. female presents to office today for annual physical exam examination.    Patient reports that she has been doing fairly well.  Continues to struggle with low blood pressures and orthostasis but admits that she does not hydrate as well as she should.  This is of course despite her daughter being very insistent that she needs to hydrate more and offering her things like liquid IV etc.  She continues to be very physically active, exercising at pickleball at least 2 times per week.  She maintains a balanced diet.  She continues to help care for her grandchildren, that she notes that she just welcomed a third grandchild/ a granddaughter  She does note that since our last visit she lapsed in Ambien  and switched over to [facility] which does seem to be working really well for her.  Sometimes she gets a little bit of sleepiness carryover to the next day but she notes that she might just be lazy.  She seems satisfied with this and is fine with not going back on Ambien  as long as it is not going to cause a problem with her current medications.  She denies any urinary issues including retention.   Occupation: retired, Substance use: none Health Maintenance Due  Topic Date Due   COVID-19 Vaccine (1) Never done   DEXA SCAN  01/24/2024   Influenza Vaccine  05/01/2024    Immunization History  Administered Date(s) Administered   Fluad Quad(high Dose 65+) 07/06/2022   INFLUENZA, HIGH DOSE SEASONAL PF 07/09/2018, 07/15/2019   Influenza Whole 07/02/2015   Influenza-Unspecified 07/13/2020   PPD Test 12/12/2022   Pneumococcal Conjugate-13 07/05/2016   Pneumococcal Polysaccharide-23 10/17/2015, 07/05/2017   Tdap 12/22/1998, 10/17/2015, 04/18/2020   Zoster Recombinant(Shingrix) 07/09/2018, 10/07/2018   Past Medical History:  Diagnosis Date   Anemia    Hiatal hernia    HTN (hypertension)    (patient denies)   ILD (interstitial lung disease) (HCC)    Insomnia    Long COVID     Neck pain    Paroxysmal A-fib (HCC)    CHADS2vasc =1   Polyarthritis    PONV (postoperative nausea and vomiting)    and had a siezure once at dentist    Seasonal allergies    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   Typical atrial flutter (HCC)    Vasovagal syncope 04/28/2013   Social History   Socioeconomic History   Marital status: Widowed    Spouse name: Not on file   Number of children: 1   Years of education: Not on file   Highest education level: Associate degree: occupational, scientist, product/process development, or vocational program  Occupational History   Occupation: retired  Tobacco Use   Smoking status: Never    Passive exposure: Past   Smokeless tobacco: Never  Vaping Use   Vaping status: Never Used  Substance and Sexual Activity   Alcohol use: Yes    Comment: occasionally when going out to eat   Drug use: No   Sexual activity: Not on file  Other Topics Concern   Not on file  Social History Narrative   Not on file   Social Drivers of Health   Financial Resource Strain: Low Risk  (07/30/2024)   Overall Financial Resource Strain (CARDIA)    Difficulty of Paying Living Expenses: Not very hard  Food Insecurity: No Food Insecurity (07/30/2024)   Hunger Vital Sign    Worried About Running Out of Food in the Last  Year: Never true    Ran Out of Food in the Last Year: Never true  Transportation Needs: No Transportation Needs (07/30/2024)   PRAPARE - Administrator, Civil Service (Medical): No    Lack of Transportation (Non-Medical): No  Physical Activity: Sufficiently Active (07/30/2024)   Exercise Vital Sign    Days of Exercise per Week: 2 days    Minutes of Exercise per Session: 120 min  Stress: No Stress Concern Present (07/30/2024)   [employer] of Occupational Health - Occupational Stress Questionnaire    Feeling of Stress: Not at all  Social Connections: Moderately Integrated (07/30/2024)   Social Connection and Isolation Panel    Frequency of  Communication with Friends and Family: More than three times a week    Frequency of Social Gatherings with Friends and Family: Three times a week    Attends Religious Services: More than 4 times per year    Active Member of Clubs or Organizations: Yes    Attends Banker Meetings: More than 4 times per year    Marital Status: Widowed  Intimate Partner Violence: Unknown (01/04/2022)   Received from [facility]   HITS    Physically Hurt: Not on file    Insult or Talk Down To: Not on file    Threaten Physical Harm: Not on file    Scream or Curse: Not on file   Past Surgical History:  Procedure Laterality Date   CARDIOVERSION N/A 04/22/2015   Procedure: CARDIOVERSION;  Surgeon: Wilbert JONELLE Bihari, MD;  Location: [facility] ENDOSCOPY;  Service: Cardiovascular;  Laterality: N/A;   KNEE ARTHROSCOPY Right 2009   PILONIDAL CYST EXCISION  1969   SHOULDER ARTHROSCOPY Right 2021   TEE WITHOUT CARDIOVERSION N/A 04/22/2015   Procedure: TRANSESOPHAGEAL ECHOCARDIOGRAM (TEE);  Surgeon: Wilbert JONELLE Bihari, MD;  Location: [facility] ENDOSCOPY;  Service: Cardiovascular;  Laterality: N/A;   TOE SURGERY Right    great toe pinning due to a break   TONSILLECTOMY  1990   TUBAL LIGATION  1983   Family History  Problem Relation Age of Onset   Hodgkin's lymphoma Mother    Heart attack Father 17   Diabetes Maternal Grandfather    Breast cancer Paternal Aunt    Colon cancer Paternal Aunt    Sleep apnea Neg Hx     Current Outpatient Medications:    acetaminophen (TYLENOL) 500 MG tablet, Take 500 mg by mouth every 6 (six) hours as needed (pain). , Disp: , Rfl:    Coenzyme Q10 (CO Q-10) 400 MG CAPS, Take 1 tablet by mouth daily., Disp: , Rfl:    escitalopram  (LEXAPRO ) 10 MG tablet, Take 1 tablet (10 mg total) by mouth daily., Disp: 90 tablet, Rfl: 3   Ferrous Sulfate (IRON SLOW RELEASE) 142 (45 Fe) MG TBCR, Take 1 tablet by mouth daily., Disp: , Rfl:    flecainide  (TAMBOCOR ) 100 MG tablet, TAKE 1 TABLET BY MOUTH TWICE  DAILY, Disp: 180 tablet, Rfl: 2   fluticasone  (FLONASE ) 50 MCG/ACT nasal spray, INSTILL TWO SPRAYS into IN EACH NOSTRIL DAILY, Disp: 16 g, Rfl: 6   loratadine (CLARITIN) 10 MG tablet, Take 10 mg by mouth daily., Disp: , Rfl:    Multiple Vitamins-Minerals (MULTIVITAMIN WITH MINERALS) tablet, Take 1 tablet by mouth daily., Disp: , Rfl:    omeprazole  (PRILOSEC) 20 MG capsule, Take 1 capsule (20 mg total) by mouth daily., Disp: 90 capsule, Rfl: 3   Probiotic Product (PROBIOTIC PO), Take 1 tablet by mouth daily.,  Disp: , Rfl:    rivaroxaban  (XARELTO ) 20 MG TABS tablet, Take 1 tablet (20 mg total) by mouth daily with supper., Disp: 90 tablet, Rfl: 3   rosuvastatin  (CRESTOR ) 20 MG tablet, Take 1 tablet (20 mg total) by mouth every other day., Disp: 45 tablet, Rfl: 3   SF 5000 PLUS 1.1 % CREA dental cream, Take by mouth as directed., Disp: , Rfl:    zolpidem  (AMBIEN ) 5 MG tablet, Take 1 tablet (5 mg total) by mouth at bedtime as needed for sleep. Put on file, Disp: 30 tablet, Rfl: 5  Allergies  Allergen Reactions   Penicillins Rash     ROS: Review of Systems Pertinent items noted in HPI and remainder of comprehensive ROS otherwise negative.    Physical exam BP (!) 87/57   Pulse 91   Temp (!) 97.2 F (36.2 C)   Ht 5' 8.25 (1.734 m)   Wt 170 lb 2 oz (77.2 kg)   SpO2 95%   BMI 25.68 kg/m  General appearance: alert, cooperative, appears stated age, and no distress Head: Normocephalic, without obvious abnormality, atraumatic Eyes: negative findings: lids and lashes normal, conjunctivae and sclerae normal, corneas clear, and pupils equal, round, reactive to light and accomodation Ears: normal TM's and external ear canals both ears Nose: Nares normal. Septum midline. Mucosa normal. No drainage or sinus tenderness. Throat: lips, mucosa, and tongue normal; teeth and gums normal Neck: no adenopathy, no carotid bruit, supple, symmetrical, trachea midline, and thyroid  not enlarged, symmetric, no  tenderness/mass/nodules Back: symmetric, no curvature. ROM normal. No CVA tenderness. Lungs: clear to auscultation bilaterally Heart: regular rate and Heather, S1, S2 normal, no murmur, click, rub or gallop Abdomen: soft, non-tender; bowel sounds normal; no masses,  no organomegaly Extremities: extremities normal, atraumatic, no cyanosis or edema Pulses: 2+ and symmetric Skin: Skin color, texture, turgor normal. No rashes or lesions Lymph nodes: No anterior or supraclavicular lymph node enlargement Neurologic: Alert and oriented X 3, normal strength and tone. Normal symmetric reflexes. Normal coordination and gait     12/09/2023   12:55 PM 06/11/2023    9:18 AM 12/12/2022   10:28 AM  Depression screen PHQ 2/9  Decreased Interest 0 0 0  Down, Depressed, Hopeless 0 0 0  PHQ - 2 Score 0 0 0  Altered sleeping 0 0 0  Tired, decreased energy 0 0 0  Change in appetite 0 0 0  Feeling bad or failure about yourself  0 0 0  Trouble concentrating 0 0 0  Moving slowly or fidgety/restless 0 0 0  Suicidal thoughts 0 0 0  PHQ-9 Score 0 0 0  Difficult doing work/chores Not difficult at all Not difficult at all Not difficult at all      12/09/2023   12:55 PM 06/11/2023    9:18 AM 12/12/2022   10:28 AM 06/12/2022    8:58 AM  GAD 7 : Generalized Anxiety Score  Nervous, Anxious, on Edge 0 0 0 0  Control/stop worrying 0 0 0 0  Worry too much - different things 0 0 0 0  Trouble relaxing 0 0 0 0  Restless 0 0 0 0  Easily annoyed or irritable 0 0 0 0  Afraid - awful might happen 0 0 0 0  Total GAD 7 Score 0 0 0 0  Anxiety Difficulty Not difficult at all Not difficult at all Not difficult at all Not difficult at all    No results found for this or any previous visit (  from the past 2160 hours).   Assessment/ Plan: Heather Keith here for annual physical exam.   Annual physical exam  Paroxysmal atrial fibrillation (HCC) - Plan: CMP14+EGFR, rivaroxaban  (XARELTO ) 20 MG TABS tablet  Mixed  hyperlipidemia - Plan: CMP14+EGFR, Lipid panel, TSH, rosuvastatin  (CRESTOR ) 20 MG tablet  Osteopenia of left forearm - Plan: CMP14+EGFR, VITAMIN D  25 Hydroxy (Vit-D Deficiency, Fractures)  Polyarthritis with positive rheumatoid factor (HCC) - Plan: CMP14+EGFR, CBC with Differential  ILD (interstitial lung disease) (HCC) - Plan: CMP14+EGFR, CBC with Differential  Primary insomnia  Moodiness - Plan: escitalopram  (LEXAPRO ) 10 MG tablet  Hiatal hernia - Plan: omeprazole  (PRILOSEC) 20 MG capsule   Her blood pressure is really soft and I suspect this is due to inadequate hydration.  We discussed utilizing electrolytes in her water at least a couple of times per week to help promote improved blood pressures.  She certainly sounds like she is having some orthostatic fluctuations when playing pickle ball.  She will continue monitoring blood pressures at home and contact me if blood pressures do not respond to adequate hydration.  Would consider midodrine maybe at that point?  If she seems to be both rate and Heather controlled today.  Not having any red flag signs or symptoms with regards to the Xarelto .  That has been renewed for her  Fasting labs were collected today.  Statin renewed  DEXA scan recommended.  She has known osteopenia.  Check calcium  and vitamin D  levels.  Polyarthritis and interstitial lung diseases are stable.  Check CBC  Continue Unisom as needed sleep since this is working well for her and she is having no red flag signs or symptoms.  I think it is safer for her than the Ambien   Mood is stable with Lexapro   GERD stable with PPI  Counseled on healthy lifestyle choices, including diet (rich in fruits, vegetables and lean meats and low in salt and simple carbohydrates) and exercise (at least 30 minutes of moderate physical activity daily).  Patient to follow up 1 year  Roverto Bodmer M. Jolinda, DO

## 2024-08-04 ENCOUNTER — Other Ambulatory Visit

## 2024-08-04 ENCOUNTER — Ambulatory Visit: Payer: Self-pay | Admitting: Family Medicine

## 2024-08-04 DIAGNOSIS — Z780 Asymptomatic menopausal state: Secondary | ICD-10-CM

## 2024-08-04 LAB — CMP14+EGFR
ALT: 18 IU/L (ref 0–32)
AST: 24 IU/L (ref 0–40)
Albumin: 4 g/dL (ref 3.8–4.8)
Alkaline Phosphatase: 69 IU/L (ref 49–135)
BUN/Creatinine Ratio: 20 (ref 12–28)
BUN: 17 mg/dL (ref 8–27)
Bilirubin Total: 0.4 mg/dL (ref 0.0–1.2)
CO2: 24 mmol/L (ref 20–29)
Calcium: 9.9 mg/dL (ref 8.7–10.3)
Chloride: 104 mmol/L (ref 96–106)
Creatinine, Ser: 0.85 mg/dL (ref 0.57–1.00)
Globulin, Total: 2.7 g/dL (ref 1.5–4.5)
Glucose: 87 mg/dL (ref 70–99)
Potassium: 4.3 mmol/L (ref 3.5–5.2)
Sodium: 141 mmol/L (ref 134–144)
Total Protein: 6.7 g/dL (ref 6.0–8.5)
eGFR: 72 mL/min/1.73 (ref >59)

## 2024-08-04 LAB — CBC WITH DIFFERENTIAL/PLATELET
Basos: 1 %
EOS (ABSOLUTE): 0.1 x10E3/uL (ref 0.0–0.2)
Eos: 3 %
Hematocrit: 45.5 % (ref 34.0–46.6)
Hemoglobin: 14.1 g/dL (ref 11.1–15.9)
Immature Granulocytes: 0 %
Immature Granulocytes: 0 x10E3/uL (ref 0.0–0.1)
Lymphs: 34 %
MCH: 28.5 pg (ref 26.6–33.0)
MCHC: 31 g/dL — AB (ref 31.5–35.7)
MCV: 92 fL (ref 79–97)
Monocytes Absolute: 0.1 x10E3/uL (ref 0.0–0.4)
Monocytes Absolute: 0.4 x10E3/uL (ref 0.1–0.9)
Monocytes: 8 %
Neutrophils Absolute: 1.8 x10E3/uL (ref 0.7–3.1)
Neutrophils Absolute: 2.9 x10E3/uL (ref 1.4–7.0)
Neutrophils: 54 %
Platelets: 226 x10E3/uL (ref 150–450)
RBC: 4.95 x10E6/uL (ref 3.77–5.28)
RDW: 13.1 % (ref 11.7–15.4)
WBC: 5.3 x10E3/uL (ref 3.4–10.8)

## 2024-08-04 LAB — LIPID PANEL
Chol/HDL Ratio: 2.5 ratio (ref 0.0–4.4)
Cholesterol, Total: 164 mg/dL (ref 100–199)
HDL: 66 mg/dL (ref >39)
LDL Chol Calc (NIH): 83 mg/dL (ref 0–99)
Triglycerides: 78 mg/dL (ref 0–149)
VLDL Cholesterol Cal: 15 mg/dL (ref 5–40)

## 2024-08-04 LAB — TSH: TSH: 2.44 u[IU]/mL (ref 0.450–4.500)

## 2024-08-04 LAB — VITAMIN D 25 HYDROXY (VIT D DEFICIENCY, FRACTURES): Vit D, 25-Hydroxy: 46.9 ng/mL (ref 30.0–100.0)

## 2024-08-07 ENCOUNTER — Ambulatory Visit: Payer: Self-pay | Admitting: Family Medicine

## 2024-09-18 ENCOUNTER — Other Ambulatory Visit: Payer: Self-pay | Admitting: Internal Medicine

## 2024-10-20 ENCOUNTER — Other Ambulatory Visit: Payer: Self-pay | Admitting: Internal Medicine

## 2024-10-27 NOTE — Telephone Encounter (Signed)
 In accordance with refill protocols, please review and address the following requirements before this medication refill can be authorized:  EKG

## 2025-08-06 ENCOUNTER — Encounter: Admitting: Family Medicine
